# Patient Record
Sex: Female | Born: 1975 | Race: White | Hispanic: No | Marital: Married | Smoking: Never smoker
Health system: Southern US, Community
[De-identification: ages and names within clinical notes are randomized; demographics above are authoritative.]

## PROBLEM LIST (undated history)

## (undated) DIAGNOSIS — R112 Nausea with vomiting, unspecified: Secondary | ICD-10-CM

## (undated) DIAGNOSIS — Z87442 Personal history of urinary calculi: Secondary | ICD-10-CM

## (undated) DIAGNOSIS — Z8489 Family history of other specified conditions: Secondary | ICD-10-CM

## (undated) DIAGNOSIS — C801 Malignant (primary) neoplasm, unspecified: Secondary | ICD-10-CM

## (undated) DIAGNOSIS — Z9889 Other specified postprocedural states: Secondary | ICD-10-CM

## (undated) DIAGNOSIS — T7840XA Allergy, unspecified, initial encounter: Secondary | ICD-10-CM

## (undated) HISTORY — DX: Allergy, unspecified, initial encounter: T78.40XA

## (undated) HISTORY — PX: HAND SURGERY: SHX662

---

## 2004-05-07 ENCOUNTER — Ambulatory Visit: Payer: Self-pay | Admitting: General Surgery

## 2010-09-06 ENCOUNTER — Ambulatory Visit: Payer: Self-pay

## 2010-10-02 ENCOUNTER — Ambulatory Visit: Payer: Self-pay | Admitting: Unknown Physician Specialty

## 2010-10-07 LAB — PATHOLOGY REPORT

## 2013-06-09 HISTORY — PX: MOHS SURGERY: SHX181

## 2014-09-28 DIAGNOSIS — Z85828 Personal history of other malignant neoplasm of skin: Secondary | ICD-10-CM | POA: Insufficient documentation

## 2017-08-24 ENCOUNTER — Encounter
Admission: RE | Disposition: A | Discharge status: Home / Self Care | Payer: Self-pay | Source: Ambulatory Visit | Attending: Obstetrics and Gynecology

## 2017-08-24 ENCOUNTER — Other Ambulatory Visit: Payer: Self-pay

## 2017-08-24 ENCOUNTER — Ambulatory Visit: Payer: BLUE CROSS/BLUE SHIELD | Admitting: Anesthesiology

## 2017-08-24 ENCOUNTER — Ambulatory Visit
Admission: RE | Admit: 2017-08-24 | Discharge: 2017-08-24 | Disposition: A | Discharge status: Home / Self Care | Payer: BLUE CROSS/BLUE SHIELD | Source: Ambulatory Visit | Attending: Obstetrics and Gynecology | Admitting: Obstetrics and Gynecology

## 2017-08-24 DIAGNOSIS — O021 Missed abortion: Secondary | ICD-10-CM | POA: Insufficient documentation

## 2017-08-24 DIAGNOSIS — Z79899 Other long term (current) drug therapy: Secondary | ICD-10-CM | POA: Diagnosis not present

## 2017-08-24 DIAGNOSIS — J302 Other seasonal allergic rhinitis: Secondary | ICD-10-CM | POA: Diagnosis not present

## 2017-08-24 HISTORY — DX: Nausea with vomiting, unspecified: R11.2

## 2017-08-24 HISTORY — DX: Family history of other specified conditions: Z84.89

## 2017-08-24 HISTORY — DX: Malignant (primary) neoplasm, unspecified: C80.1

## 2017-08-24 HISTORY — DX: Other specified postprocedural states: Z98.890

## 2017-08-24 HISTORY — DX: Personal history of urinary calculi: Z87.442

## 2017-08-24 HISTORY — PX: DILATION AND EVACUATION: SHX1459

## 2017-08-24 LAB — CBC
HCT: 39.7 % (ref 35.0–47.0)
HEMOGLOBIN: 13.5 g/dL (ref 12.0–16.0)
MCH: 30.7 pg (ref 26.0–34.0)
MCHC: 33.9 g/dL (ref 32.0–36.0)
MCV: 90.6 fL (ref 80.0–100.0)
Platelets: 311 10*3/uL (ref 150–440)
RBC: 4.38 MIL/uL (ref 3.80–5.20)
RDW: 12.9 % (ref 11.5–14.5)
WBC: 7 10*3/uL (ref 3.6–11.0)

## 2017-08-24 LAB — TYPE AND SCREEN
ABO/RH(D): O POS
Antibody Screen: NEGATIVE

## 2017-08-24 LAB — BASIC METABOLIC PANEL
Anion gap: 8 (ref 5–15)
BUN: 13 mg/dL (ref 6–20)
CHLORIDE: 105 mmol/L (ref 101–111)
CO2: 22 mmol/L (ref 22–32)
Calcium: 8.9 mg/dL (ref 8.9–10.3)
Creatinine, Ser: 0.89 mg/dL (ref 0.44–1.00)
GFR calc Af Amer: >60 mL/min (ref >60)
GFR calc non Af Amer: >60 mL/min (ref >60)
GLUCOSE: 89 mg/dL (ref 65–99)
POTASSIUM: 3.9 mmol/L (ref 3.5–5.1)
Sodium: 135 mmol/L (ref 135–145)

## 2017-08-24 SURGERY — DILATION AND EVACUATION, UTERUS
Anesthesia: General

## 2017-08-24 MED ORDER — PROPOFOL 500 MG/50ML IV EMUL
INTRAVENOUS | Status: DC | PRN
  Administered 2017-08-24: 200 ug/kg/min via INTRAVENOUS

## 2017-08-24 MED ORDER — CEFAZOLIN SODIUM-DEXTROSE 2-4 GM/100ML-% IV SOLN
INTRAVENOUS | Status: AC
  Filled 2017-08-24: qty 100

## 2017-08-24 MED ORDER — PROPOFOL 500 MG/50ML IV EMUL
INTRAVENOUS | Status: AC
  Filled 2017-08-24: qty 50

## 2017-08-24 MED ORDER — DEXAMETHASONE SODIUM PHOSPHATE 10 MG/ML IJ SOLN
INTRAMUSCULAR | Status: AC
  Filled 2017-08-24: qty 1

## 2017-08-24 MED ORDER — HYDROMORPHONE HCL 1 MG/ML IJ SOLN
INTRAMUSCULAR | Status: DC | PRN
  Administered 2017-08-24 (×2): 0.5 mg via INTRAVENOUS

## 2017-08-24 MED ORDER — FAMOTIDINE 20 MG PO TABS
ORAL_TABLET | ORAL | Status: AC
  Administered 2017-08-24: 20 mg via ORAL
  Filled 2017-08-24: qty 1

## 2017-08-24 MED ORDER — ONDANSETRON HCL 4 MG/2ML IJ SOLN
INTRAMUSCULAR | Status: AC
  Filled 2017-08-24: qty 2

## 2017-08-24 MED ORDER — FENTANYL CITRATE (PF) 100 MCG/2ML IJ SOLN
INTRAMUSCULAR | Status: AC
  Administered 2017-08-24: 25 ug via INTRAVENOUS
  Filled 2017-08-24: qty 2

## 2017-08-24 MED ORDER — KETOROLAC TROMETHAMINE 30 MG/ML IJ SOLN
INTRAMUSCULAR | Status: AC
  Filled 2017-08-24: qty 1

## 2017-08-24 MED ORDER — LACTATED RINGERS IV SOLN: INTRAVENOUS | Status: DC

## 2017-08-24 MED ORDER — FAMOTIDINE 20 MG PO TABS
20.0000 mg | ORAL_TABLET | Freq: Once | ORAL | Status: AC
  Administered 2017-08-24: 20 mg via ORAL

## 2017-08-24 MED ORDER — GLYCOPYRROLATE 0.2 MG/ML IJ SOLN
INTRAMUSCULAR | Status: AC
  Filled 2017-08-24: qty 1

## 2017-08-24 MED ORDER — LIDOCAINE HCL (PF) 2 % IJ SOLN
INTRAMUSCULAR | Status: AC
  Filled 2017-08-24: qty 10

## 2017-08-24 MED ORDER — CEFAZOLIN SODIUM-DEXTROSE 2-4 GM/100ML-% IV SOLN
2.0000 g | Freq: Once | INTRAVENOUS | Status: AC
  Administered 2017-08-24: 2 g via INTRAVENOUS

## 2017-08-24 MED ORDER — PROPOFOL 10 MG/ML IV BOLUS
INTRAVENOUS | Status: DC | PRN
  Administered 2017-08-24: 200 mg via INTRAVENOUS

## 2017-08-24 MED ORDER — ONDANSETRON HCL 4 MG/2ML IJ SOLN
INTRAMUSCULAR | Status: DC | PRN
  Administered 2017-08-24: 4 mg via INTRAVENOUS

## 2017-08-24 MED ORDER — SCOPOLAMINE 1 MG/3DAYS TD PT72
1.0000 | MEDICATED_PATCH | TRANSDERMAL | Status: DC
  Administered 2017-08-24: 1.5 mg via TRANSDERMAL

## 2017-08-24 MED ORDER — KETOROLAC TROMETHAMINE 30 MG/ML IJ SOLN
INTRAMUSCULAR | Status: DC | PRN
  Administered 2017-08-24: 30 mg via INTRAVENOUS

## 2017-08-24 MED ORDER — GLYCOPYRROLATE 0.2 MG/ML IJ SOLN
INTRAMUSCULAR | Status: DC | PRN
  Administered 2017-08-24: 0.1 mg via INTRAVENOUS

## 2017-08-24 MED ORDER — PROPOFOL 10 MG/ML IV BOLUS
INTRAVENOUS | Status: AC
  Filled 2017-08-24: qty 20

## 2017-08-24 MED ORDER — LIDOCAINE HCL (CARDIAC) 20 MG/ML IV SOLN
INTRAVENOUS | Status: DC | PRN
  Administered 2017-08-24: 80 mg via INTRAVENOUS

## 2017-08-24 MED ORDER — SCOPOLAMINE 1 MG/3DAYS TD PT72
MEDICATED_PATCH | TRANSDERMAL | Status: AC
  Administered 2017-08-24: 1.5 mg via TRANSDERMAL
  Filled 2017-08-24: qty 1

## 2017-08-24 MED ORDER — LACTATED RINGERS IV SOLN
INTRAVENOUS | Status: DC
  Administered 2017-08-24 (×2): via INTRAVENOUS

## 2017-08-24 MED ORDER — MIDAZOLAM HCL 5 MG/5ML IJ SOLN
INTRAMUSCULAR | Status: AC
  Filled 2017-08-24: qty 5

## 2017-08-24 MED ORDER — PROMETHAZINE HCL 25 MG/ML IJ SOLN: 6.2500 mg | INTRAMUSCULAR | Status: DC | PRN

## 2017-08-24 MED ORDER — DEXAMETHASONE SODIUM PHOSPHATE 10 MG/ML IJ SOLN
INTRAMUSCULAR | Status: DC | PRN
  Administered 2017-08-24: 10 mg via INTRAVENOUS

## 2017-08-24 MED ORDER — MIDAZOLAM HCL 2 MG/2ML IJ SOLN
INTRAMUSCULAR | Status: DC | PRN
  Administered 2017-08-24: 3 mg via INTRAVENOUS
  Administered 2017-08-24: 2 mg via INTRAVENOUS

## 2017-08-24 MED ORDER — ACETAMINOPHEN 10 MG/ML IV SOLN
INTRAVENOUS | Status: AC
  Filled 2017-08-24: qty 100

## 2017-08-24 MED ORDER — FENTANYL CITRATE (PF) 100 MCG/2ML IJ SOLN
25.0000 ug | INTRAMUSCULAR | Status: DC | PRN
  Administered 2017-08-24: 25 ug via INTRAVENOUS

## 2017-08-24 MED ORDER — HYDROMORPHONE HCL 1 MG/ML IJ SOLN
INTRAMUSCULAR | Status: AC
  Filled 2017-08-24: qty 1

## 2017-08-24 MED ORDER — ACETAMINOPHEN 10 MG/ML IV SOLN
INTRAVENOUS | Status: DC | PRN
  Administered 2017-08-24: 1000 mg via INTRAVENOUS

## 2017-08-24 SURGICAL SUPPLY — 20 items
CATH ROBINSON RED A/P 16FR (CATHETERS) ×3 IMPLANT
FILTER UTR ASPR SPEC (MISCELLANEOUS) ×1 IMPLANT
FLTR UTR ASPR SPEC (MISCELLANEOUS) ×3
GLOVE BIO SURGEON STRL SZ8 (GLOVE) ×3 IMPLANT
GOWN STRL REUS W/ TWL LRG LVL3 (GOWN DISPOSABLE) ×1 IMPLANT
GOWN STRL REUS W/ TWL XL LVL3 (GOWN DISPOSABLE) ×1 IMPLANT
GOWN STRL REUS W/TWL LRG LVL3 (GOWN DISPOSABLE) ×2
GOWN STRL REUS W/TWL XL LVL3 (GOWN DISPOSABLE) ×2
KIT TURNOVER CYSTO (KITS) ×3 IMPLANT
PACK DNC HYST (MISCELLANEOUS) ×3 IMPLANT
PAD OB MATERNITY 4.3X12.25 (PERSONAL CARE ITEMS) ×3 IMPLANT
PAD PREP 24X41 OB/GYN DISP (PERSONAL CARE ITEMS) ×3 IMPLANT
SET BERKELEY SUCTION TUBING (SUCTIONS) ×3 IMPLANT
TOWEL OR 17X26 4PK STRL BLUE (TOWEL DISPOSABLE) ×3 IMPLANT
VACURETTE 10 RIGID CVD (CANNULA) IMPLANT
VACURETTE 6 ASPIR F TIP BERK (CANNULA) IMPLANT
VACURETTE 7MM F TIP (CANNULA)
VACURETTE 7MM F TIP STRL (CANNULA) IMPLANT
VACURETTE 8 RIGID CVD (CANNULA) IMPLANT
VACURETTE 8MM F TIP (MISCELLANEOUS) ×3 IMPLANT

## 2017-08-24 NOTE — Anesthesia Post-op Follow-up Note (Signed)
Anesthesia QCDR form completed.        

## 2017-08-24 NOTE — OR Nursing (Signed)
Discharge instructions discussed with pt and husband. Both voice understanding. 

## 2017-08-24 NOTE — H&P (Signed)
Chief Complaint:    Diane Love is a 42 y.o. female here for Referred by Lurena Joiner McVey-missed ab .pt with LMP 06/24/17 = 8+2 weeks   u/s from 08/17/17 shows irregular thickening with projections into endometrial cavity . Quant 56,000 and now 50,000. No specific pain     Past Medical History:  has a past medical history of Seasonal allergies.  Past Surgical History:  has no past surgical history on file. Family History: family history is not on file. Social History:  reports that she has never smoked. She has never used smokeless tobacco. She reports that she drinks alcohol. She reports that she does not use drugs. OB/GYN History:          OB History    Gravida  3   Para  1   Term  1   Preterm      AB  1   Living  1     SAB      TAB  1   Ectopic      Molar      Multiple      Live Births  1          Allergies: has No Known Allergies. Medications:  Current Outpatient Medications:  .  CETIRIZINE HCL (ZYRTEC ORAL), Take by mouth., Disp: , Rfl:  .  fexofenadine (ALLEGRA) 180 MG tablet, Take 180 mg by mouth once daily., Disp: , Rfl:  .  fluticasone (FLONASE) 50 mcg/actuation nasal spray, Place 2 sprays into both nostrils once daily., Disp: , Rfl:  .  multivitamin tablet, Take 1 tablet by mouth once daily., Disp: , Rfl:  .  UNABLE TO FIND, Sublingual Allergy drops, Disp: , Rfl:   Review of Systems: General:                      No fatigue or weight loss Eyes:                           No vision changes Ears:                            No hearing difficulty Respiratory:                No cough or shortness of breath Pulmonary:                  No asthma or shortness of breath Cardiovascular:           No chest pain, palpitations, dyspnea on exertion Gastrointestinal:          No abdominal bloating, chronic diarrhea, constipations, masses, pain or hematochezia Genitourinary:             No hematuria, dysuria, abnormal vaginal discharge, pelvic pain,  Menometrorrhagia Lymphatic:                   No swollen lymph nodes Musculoskeletal:         No muscle weakness Neurologic:                  No extremity weakness, syncope, seizure disorder Psychiatric:                  No history of depression, delusions or suicidal/homicidal ideation    Exam:   Vitals:   08/21/17 0920  BP: 114/73  Pulse: 97    Body mass  index is 26.46 kg/m.  WDWN white/ female in NAD   Lungs: CTA  CV : RRR without murmur   Breast: exam done in sitting and lying position : No dimpling or retraction, no dominant mass, no spontaneous discharge, no axillary adenopathy Neck:  no thyromegaly Abdomen: soft , no mass, normal active bowel sounds,  non-tender, no rebound tenderness Pelvic: tanner stage 5 ,  External genitalia: vulva /labia no lesions Urethra: no prolapse Vagina: normal physiologic d/c Cervix: no lesions, no cervical motion tenderness   Uterus: 8 weeks , non-tender Adnexa: no mass,  non-tender   Rectovaginal: no mass heme   Impression:   The encounter diagnosis was Missed abortion. r/o molar pregnancy     Plan:   Suction Dilation and curettage   Benefits and risks to surgery: The proposed benefit of the surgery has been discussed with the patient. The possible risks include, but are not limited to: organ injury to the bowel , bladder, ureters, and major blood vessels and nerves. There is a possibility of additional surgeries resulting from these injuries. There is also the risk of blood transfusion and the need to receive blood products during or after the procedure which may rarely lead to HIV or Hepatitis C infection. There is a risk of developing a deep venous thrombosis or a pulmonary embolism . There is the possibility of wound infection and also anesthetic complications, even the rare possibility of death. The patient understands these risks and wishes to proceed. All questions have been answered and the consent has been  signed

## 2017-08-24 NOTE — Progress Notes (Signed)
Pt ready for suction D+C . NPO. All questions answered . Proceed

## 2017-08-24 NOTE — Anesthesia Procedure Notes (Signed)
Procedure Name: LMA Insertion Date/Time: 08/24/2017 12:56 PM Performed by: Sherol DadeMacMang, Kalijah Zeiss H, CRNA Pre-anesthesia Checklist: Patient identified, Emergency Drugs available, Suction available, Patient being monitored and Timeout performed Patient Re-evaluated:Patient Re-evaluated prior to induction Oxygen Delivery Method: Circle system utilized Preoxygenation: Pre-oxygenation with 100% oxygen Induction Type: IV induction Ventilation: Mask ventilation without difficulty LMA: LMA inserted LMA Size: 3.5 Number of attempts: 1 Placement Confirmation: positive ETCO2,  CO2 detector and breath sounds checked- equal and bilateral Tube secured with: Tape

## 2017-08-24 NOTE — Discharge Instructions (Signed)

## 2017-08-24 NOTE — Op Note (Signed)
[name], [name]                ACCOUNT NO.:  [id number]  MEDICAL RECORD NO.:  [id number]  LOCATION:                                 FACILITY:  PHYSICIAN:  Jennell Cornerhomas Schermerhorn, MDDATE OF BIRTH:  [birth year]  DATE OF PROCEDURE:  08/24/2017 DATE OF DISCHARGE:                              OPERATIVE REPORT   PREOPERATIVE DIAGNOSIS:  Missed abortion.  POSTOPERATIVE DIAGNOSIS:  Missed abortion.  PROCEDURE PERFORMED:  Suction dilation curettage.  SURGEON:  Jennell Cornerhomas Schermerhorn, MD  ANESTHESIA:  General endotracheal anesthesia.  INDICATION:  This is a 42 year old, gravida 3, para 1 patient with abnormal rising quantitative hCGs with plateauing.  Last quantitative hCG was 50,000 and clinic ultrasound failed to show intrauterine fetal pole.  There was evidence of just an irregular-shaped gestational sac.  DESCRIPTION OF PROCEDURE:  After adequate general endotracheal anesthesia, the patient was placed in dorsal supine position with the legs in the candy-cane stirrups.  Lower abdomen, perineum, and vagina were prepped and draped in normal sterile fashion.  The patient did receive 2 g IV Ancef for surgical prophylaxis prior to commencement of the case.  Time-out was performed.  Straight catheterization of the bladder yielded 100 mL clear urine.  A weighted speculum was placed in the posterior vaginal vault and the anterior cervix was grasped with single-tooth tenaculum.  The uterus was mid plane positioned based on examination.  The cervix was then dilated to #20 Hanks dilator without difficulty.  A #8 flexible suction curette was placed into the endometrial cavity and tissue was removed consistent with products of conception.  A sharp curettage was performed with good uterine cry throughout and repeat suction curetting was performed with no additional tissue.  Good hemostasis was noted.  There were no complications.  The patient tolerated the procedure well.  ESTIMATED BLOOD LOSS:   25 mL.  URINE OUTPUT:  100 mL.  INTRAOPERATIVE FLUIDS:  1 L.  The patient was taken to recovery room in good condition.          ______________________________ Jennell Cornerhomas Schermerhorn, MD     TS/MEDQ  D:  08/24/2017  T:  08/24/2017  Job:  [id number]

## 2017-08-24 NOTE — Transfer of Care (Signed)
Immediate Anesthesia Transfer of Care Note  Patient: Diane Love  Procedure(s) Performed: DILATATION AND EVACUATION (N/A )  Patient Location: PACU  Anesthesia Type:General  Level of Consciousness: drowsy and patient cooperative  Airway & Oxygen Therapy: Patient Spontanous Breathing and Patient connected to face mask oxygen  Post-op Assessment: Report given to RN, Post -op Vital signs reviewed and stable and Patient moving all extremities X 4  Post vital signs: Reviewed and stable  Last Vitals:  Vitals:   08/24/17 1051 08/24/17 1334  BP: 116/63 (!) 97/59  Pulse: 90 89  Resp: 20 13  Temp: 36.6 C 36.7 C  SpO2: 100% 100%    Last Pain:  Vitals:   08/24/17 1051  TempSrc: Oral         Complications: No apparent anesthesia complications

## 2017-08-24 NOTE — Brief Op Note (Signed)
08/24/2017  1:17 PM  PATIENT:  Jon BillingsAndrea L Anzaldo  42 y.o. female  PRE-OPERATIVE DIAGNOSIS:  Missed Abortion  POST-OPERATIVE DIAGNOSIS: same PROCEDURE:  Procedure(s): DILATATION AND EVACUATION (N/A) Suction  SURGEON:  Surgeon(s) and Role:    * Schermerhorn, Ihor Austinhomas J, MD - Primary  PHYSICIAN ASSISTANT:   ASSISTANTS: none   ANESTHESIA:   general  EBL:  25 mL   BLOOD ADMINISTERED:none  DRAINS: none   LOCAL MEDICATIONS USED:  NONE  SPECIMEN:  Source of Specimen:  products of conception   DISPOSITION OF SPECIMEN:  PATHOLOGY  COUNTS:  YES  TOURNIQUET:  * No tourniquets in log *  DICTATION: .Other Dictation: Dictation Number verbal  PLAN OF CARE: Discharge to home after PACU  PATIENT DISPOSITION:  PACU - hemodynamically stable.   Delay start of Pharmacological VTE agent (>24hrs) due to surgical blood loss or risk of bleeding: not applicable

## 2017-08-24 NOTE — Anesthesia Preprocedure Evaluation (Signed)
Anesthesia Evaluation  Patient identified by MRN, date of birth, ID band Patient awake    Reviewed: Allergy & Precautions, H&P , NPO status , Patient's Chart, lab work & pertinent test results  History of Anesthesia Complications (+) PONV, Family history of anesthesia reaction and history of anesthetic complications  Airway Mallampati: III  TM Distance: <3 FB Neck ROM: full    Dental  (+) Chipped, Poor Dentition   Pulmonary neg pulmonary ROS, neg shortness of breath,           Cardiovascular Exercise Tolerance: Good (-) angina(-) Past MI and (-) DOE negative cardio ROS       Neuro/Psych negative neurological ROS  negative psych ROS   GI/Hepatic negative GI ROS, Neg liver ROS, neg GERD  ,  Endo/Other  negative endocrine ROS  Renal/GU      Musculoskeletal   Abdominal   Peds  Hematology negative hematology ROS (+)   Anesthesia Other Findings Past Medical History: No date: Cancer (HCC) No date: Family history of adverse reaction to anesthesia No date: History of kidney stones No date: PONV (postoperative nausea and vomiting)  Past Surgical History: No date: HAND SURGERY; Right     Comment:  Thumb tumor excision 2015: MOHS SURGERY     Comment:  BCC excision nose  BMI    Body Mass Index:  26.46 kg/m      Reproductive/Obstetrics negative OB ROS                             Anesthesia Physical Anesthesia Plan  ASA: II  Anesthesia Plan: General LMA   Post-op Pain Management:    Induction: Intravenous  PONV Risk Score and Plan: Ondansetron, Dexamethasone, Propofol infusion, TIVA, Midazolam and Scopolamine patch - Pre-op  Airway Management Planned: LMA  Additional Equipment:   Intra-op Plan:   Post-operative Plan: Extubation in OR  Informed Consent: I have reviewed the patients History and Physical, chart, labs and discussed the procedure including the risks, benefits and  alternatives for the proposed anesthesia with the patient or authorized representative who has indicated his/her understanding and acceptance.   Dental Advisory Given  Plan Discussed with: Anesthesiologist, CRNA and Surgeon  Anesthesia Plan Comments: (Patient consented for risks of anesthesia including but not limited to:  - adverse reactions to medications - damage to teeth, lips or other oral mucosa - sore throat or hoarseness - Damage to heart, brain, lungs or loss of life  Patient voiced understanding.)        Anesthesia Quick Evaluation

## 2017-08-25 ENCOUNTER — Encounter: Payer: Self-pay | Admitting: Obstetrics and Gynecology

## 2017-08-25 LAB — SURGICAL PATHOLOGY

## 2017-08-25 NOTE — Anesthesia Postprocedure Evaluation (Signed)
Anesthesia Post Note  Patient: Diane Love  Procedure(s) Performed: DILATATION AND EVACUATION (N/A )  Patient location during evaluation: PACU Anesthesia Type: General Level of consciousness: awake and alert Pain management: pain level controlled Vital Signs Assessment: post-procedure vital signs reviewed and stable Respiratory status: spontaneous breathing, nonlabored ventilation, respiratory function stable and patient connected to nasal cannula oxygen Cardiovascular status: blood pressure returned to baseline and stable Postop Assessment: no apparent nausea or vomiting Anesthetic complications: no     Last Vitals:  Vitals:   08/24/17 1500 08/24/17 1505  BP: (!) 94/59 98/62  Pulse: 62 (!) 56  Resp: 16 16  Temp: 37.3 C   SpO2: 100% 100%    Last Pain:  Vitals:   08/24/17 1427  TempSrc:   PainSc: 4                  Cleda MccreedyJoseph K Piscitello

## 2017-11-23 ENCOUNTER — Other Ambulatory Visit: Payer: Self-pay | Admitting: Obstetrics and Gynecology

## 2017-11-23 DIAGNOSIS — Z1231 Encounter for screening mammogram for malignant neoplasm of breast: Secondary | ICD-10-CM

## 2017-12-14 ENCOUNTER — Encounter (HOSPITAL_COMMUNITY): Payer: Self-pay

## 2017-12-14 ENCOUNTER — Ambulatory Visit
Admission: RE | Admit: 2017-12-14 | Discharge: 2017-12-14 | Disposition: A | Discharge status: Home / Self Care | Payer: BLUE CROSS/BLUE SHIELD | Source: Ambulatory Visit | Attending: Obstetrics and Gynecology | Admitting: Obstetrics and Gynecology

## 2017-12-14 DIAGNOSIS — Z1231 Encounter for screening mammogram for malignant neoplasm of breast: Secondary | ICD-10-CM

## 2018-02-24 ENCOUNTER — Other Ambulatory Visit: Payer: Self-pay | Admitting: Unknown Physician Specialty

## 2018-02-24 DIAGNOSIS — M25462 Effusion, left knee: Secondary | ICD-10-CM

## 2018-02-24 DIAGNOSIS — M25562 Pain in left knee: Principal | ICD-10-CM

## 2018-02-24 DIAGNOSIS — G8929 Other chronic pain: Secondary | ICD-10-CM

## 2018-02-27 ENCOUNTER — Ambulatory Visit
Admission: RE | Admit: 2018-02-27 | Discharge: 2018-02-27 | Disposition: A | Discharge status: Home / Self Care | Payer: BLUE CROSS/BLUE SHIELD | Source: Ambulatory Visit | Attending: Unknown Physician Specialty | Admitting: Unknown Physician Specialty

## 2018-02-27 DIAGNOSIS — M7122 Synovial cyst of popliteal space [Baker], left knee: Secondary | ICD-10-CM | POA: Diagnosis not present

## 2018-02-27 DIAGNOSIS — G8929 Other chronic pain: Secondary | ICD-10-CM | POA: Diagnosis present

## 2018-02-27 DIAGNOSIS — M25462 Effusion, left knee: Secondary | ICD-10-CM | POA: Insufficient documentation

## 2018-02-27 DIAGNOSIS — M25562 Pain in left knee: Secondary | ICD-10-CM | POA: Insufficient documentation

## 2019-04-21 ENCOUNTER — Other Ambulatory Visit: Payer: Self-pay

## 2019-04-21 DIAGNOSIS — Z20822 Contact with and (suspected) exposure to covid-19: Secondary | ICD-10-CM

## 2019-04-23 LAB — NOVEL CORONAVIRUS, NAA: SARS-CoV-2, NAA: NOT DETECTED

## 2019-06-23 IMAGING — MR MR KNEE*L* W/O CM
6 series · 40 of 40 positions shown · non-contrast
Comparison: None.

CLINICAL DATA: Chronic knee pain and swelling. Increased swelling
with exercise. Symptoms for 6 months. No acute injury or prior
relevant surgery.

EXAM:
MRI OF THE LEFT KNEE WITHOUT CONTRAST
TECHNIQUE: Multiplanar, multisequence MR imaging of the knee was performed. No
intravenous contrast was administered.

[Series 12: T2 fat-sat · axial · left · 4.0mm · 0.50mm/px · z∈[-63,+61]mm · 7 of 26 slices shown (1 of 3)]
[im 1/26]
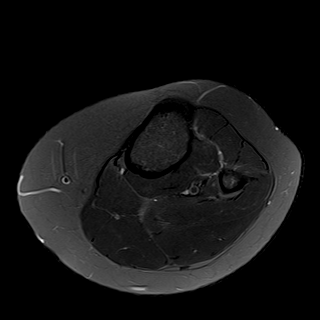
[im 5/26]
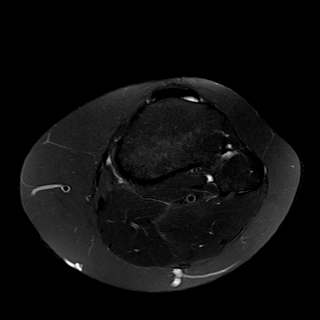
[im 9/26]
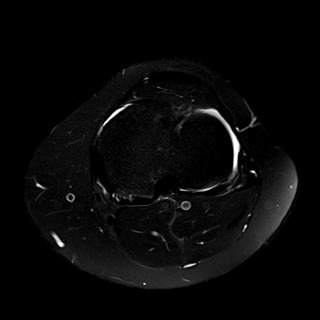
[im 13/26]
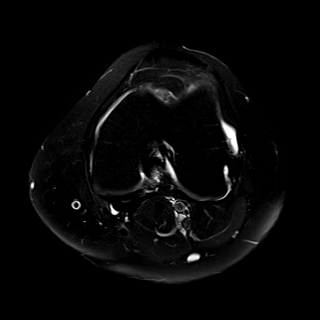
[im 17/26]
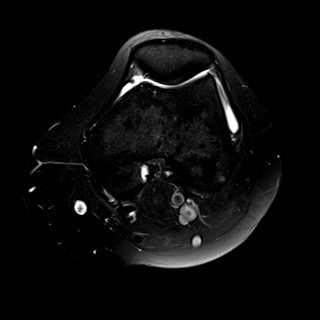
[im 21/26]
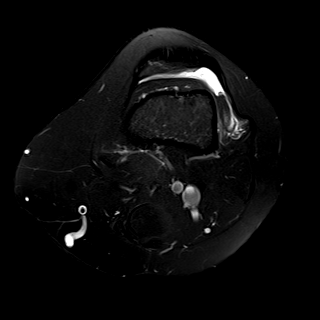
[im 26/26]
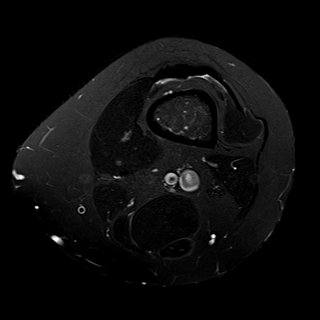

[Series 13: T1 · coronal · left · 4.0mm · 0.59mm/px · 6 of 26 slices shown]
[im 1/26]
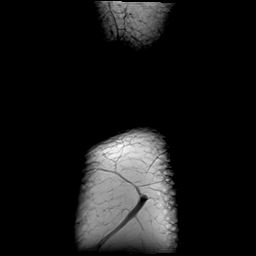
[im 6/26]
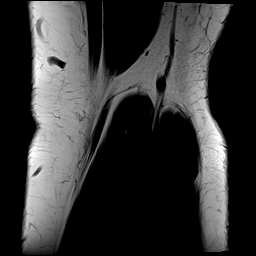
[im 11/26]
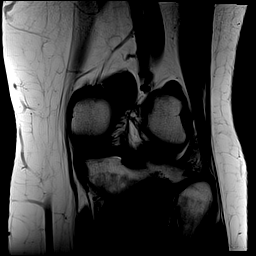
[im 16/26]
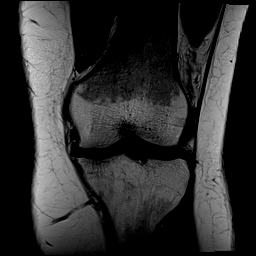
[im 21/26]
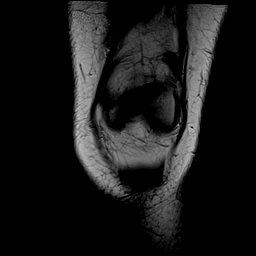
[im 26/26]
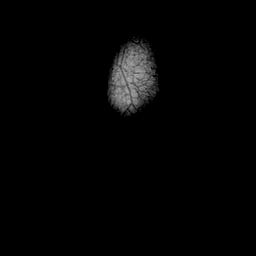

[Series 15: PD fat-sat · coronal · left · 4.0mm · 0.59mm/px · 6 of 25 slices shown (1 of 2)]
[im 1/25]
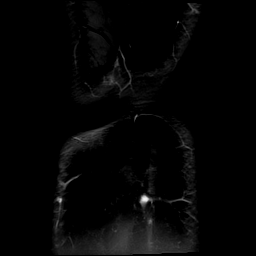
[im 5/25]
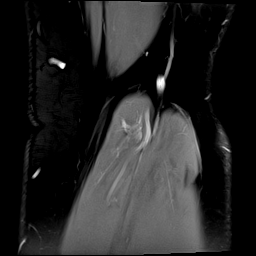
[im 10/25]
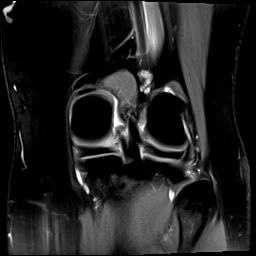
[im 15/25]
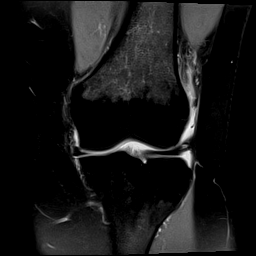
[im 20/25]
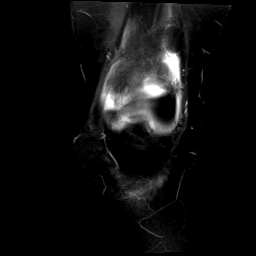
[im 25/25]
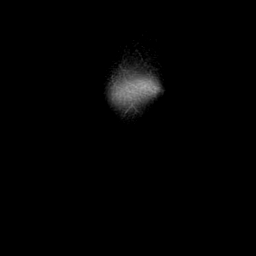

[Series 16: T2 fat-sat · coronal · left · 4.0mm · 0.59mm/px · 7 of 28 slices shown (2 of 3)]
[im 1/28]
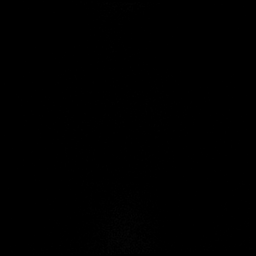
[im 5/28]
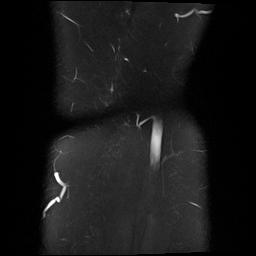
[im 10/28]
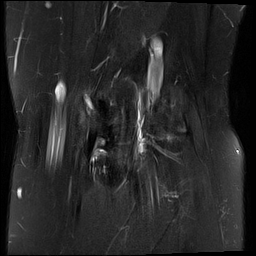
[im 14/28]
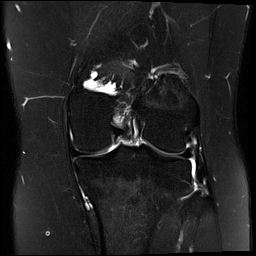
[im 19/28]
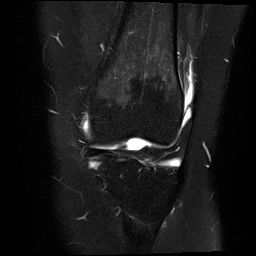
[im 23/28]
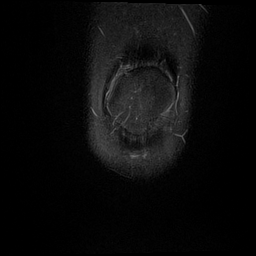
[im 28/28]
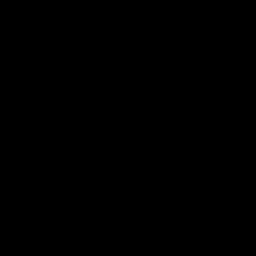

[Series 17: PD fat-sat · sagittal · left · 3.0mm · 0.59mm/px · 7 of 28 slices shown (2 of 2)]
[im 1/28]
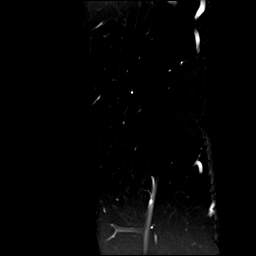
[im 5/28]
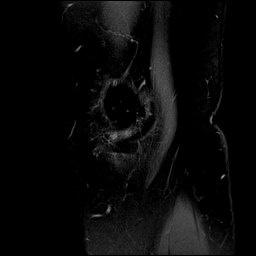
[im 10/28]
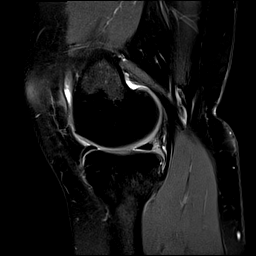
[im 14/28]
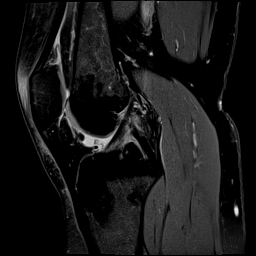
[im 19/28]
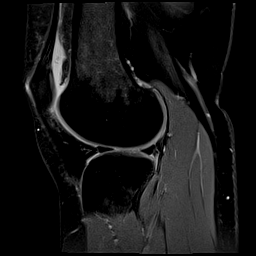
[im 23/28]
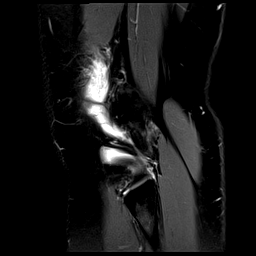
[im 28/28]
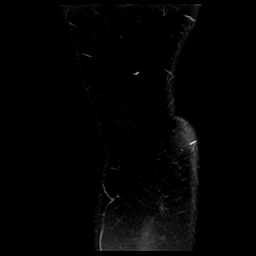

[Series 18: T2 fat-sat · sagittal · left · 3.0mm · 0.59mm/px · 7 of 28 slices shown (3 of 3)]
[im 1/28]
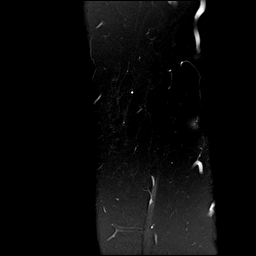
[im 5/28]
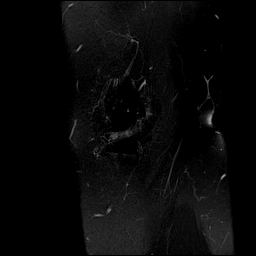
[im 10/28]
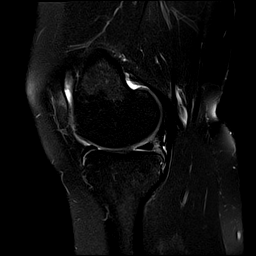
[im 14/28]
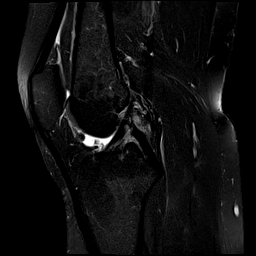
[im 19/28]
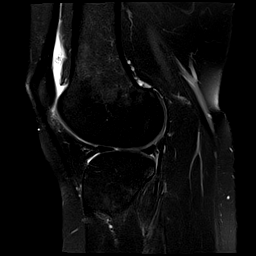
[im 23/28]
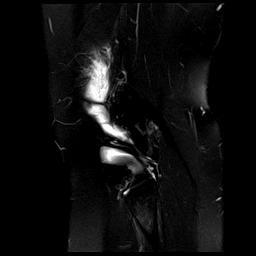
[im 28/28]
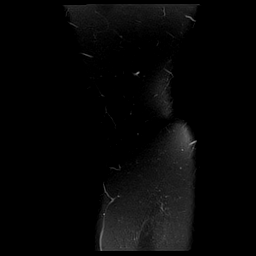

[40 of 40 positions shown; findings below may reference images not displayed]

FINDINGS: MENISCI

Medial meniscus:  Intact with normal morphology.

Lateral meniscus:  Intact with normal morphology.

LIGAMENTS

Cruciates:  Intact.

Collaterals:  Intact.

CARTILAGE

Patellofemoral:  Preserved.

Medial:  Preserved.

Lateral:  Preserved.

MISCELLANEOUS

Joint:  Small joint effusion.

Popliteal Fossa:  Tiny Baker's cyst.

Extensor Mechanism:  Intact.

Bones:  No acute or significant extra-articular osseous findings.

Other: No other significant periarticular soft tissue findings.
IMPRESSION: 1. Small joint effusion and tiny Baker's cyst.
2. No evidence of internal derangement. The menisci, cruciate and
collateral ligaments appear normal.
3. No acute osseous findings or significant arthropathic changes.

## 2022-06-03 ENCOUNTER — Ambulatory Visit: Payer: 59 | Admitting: Family Medicine

## 2022-07-25 ENCOUNTER — Telehealth: Payer: Self-pay | Admitting: Family Medicine

## 2022-08-17 NOTE — Progress Notes (Unsigned)
I,Sha'taria Tyson,acting as a Education administrator for Gwyneth Sprout, FNP.,have documented all relevant documentation on the behalf of Gwyneth Sprout, FNP,as directed by  Gwyneth Sprout, FNP while in the presence of Gwyneth Sprout, FNP.   New patient visit  Patient: Diane Love   DOB: March 03, 1976   47 y.o. Female  MRN: [id number] Visit Date: 08/18/2022  Today's healthcare provider: Gwyneth Sprout, FNP  Patient presents for new patient visit to establish care.  Introduced to Designer, jewellery role and practice setting.  All questions answered.  Discussed provider/patient relationship and expectations.  Subjective    Diane Love is a 47 y.o. female who presents today as a new patient to establish care.   HPI  -Pap Smear: [place] [id number] 09/2016; due at this time; pt wishes to defer. -Colonoscopy: Speak with insurance to make sure it is covered; OK to defer at this time -Vaccines: Patient has record at home mainly gets through work or pharmacy; encouraged to upload thru mychart as a message attachment  Past Medical History:  Diagnosis Date   Allergy    Seasonal since moving to  in [place] ([place])    Skin, basil cell & pre carcinoma   Family history of adverse reaction to anesthesia    History of kidney stones    PONV (postoperative nausea and vomiting)    Past Surgical History:  Procedure Laterality Date   DILATION AND EVACUATION N/A 08/24/2017   Procedure: DILATATION AND EVACUATION;  Surgeon: Schermerhorn, Gwen Her, MD;  Location: [facility] ORS;  Service: Gynecology;  Laterality: N/A;   HAND SURGERY Right    Thumb tumor excision   MOHS SURGERY  2015   BCC excision nose   Family Status  Relation Name Status   Mother Theophilus Kinds (Not Specified)   MGM Gay Filler (Not Specified)   PGM Curt Bears (Not Specified)   Family History  Problem Relation Age of Onset   Breast cancer Mother 65   Cancer Mother    Varicose Veins Mother    Diabetes Maternal Grandmother    Diabetes  Paternal Grandmother    Social History   Socioeconomic History   Marital status: Married    Spouse name: Not on file   Number of children: Not on file   Years of education: Not on file   Highest education level: Not on file  Occupational History   Not on file  Tobacco Use   Smoking status: Never   Smokeless tobacco: Never  Vaping Use   Vaping Use: Never used  Substance and Sexual Activity   Alcohol use: Yes    Alcohol/week: 3.0 standard drinks of alcohol    Types: 3 Glasses of wine per week    Comment: rarely   Drug use: No   Sexual activity: Yes    Birth control/protection: None  Other Topics Concern   Not on file  Social History Narrative   Not on file   Social Determinants of Health   Financial Resource Strain: Not on file  Food Insecurity: Not on file  Transportation Needs: Not on file  Physical Activity: Not on file  Stress: Not on file  Social Connections: Not on file   Outpatient Medications Prior to Visit  Medication Sig   Cetirizine HCl 10 MG CAPS Take 10 mg by mouth daily.   Fexofenadine HCl (ALLEGRA PO) Take by mouth daily.   fluticasone (FLONASE) 50 MCG/ACT nasal spray Place 1 spray into both nostrils daily as  needed for allergies or rhinitis.   loratadine (CLARITIN) 10 MG tablet Take 10 mg by mouth daily. (Patient not taking: Reported on 08/18/2022)   No facility-administered medications prior to visit.   No Known Allergies  Immunization History  Administered Date(s) Administered   Influenza,inj,Quad PF,6+ Mos 02/25/2019   Influenza-Unspecified 02/11/2022    Health Maintenance  Topic Date Due   COVID-19 Vaccine (1) Never done   Hepatitis C Screening  Never done   DTaP/Tdap/Td (1 - Tdap) Never done   PAP SMEAR-Modifier  Never done   COLONOSCOPY (Pts 45-27yr Insurance coverage will need to be confirmed)  Never done   INFLUENZA VACCINE  Completed   HIV Screening  Completed   HPV VACCINES  Aged Out    Patient Care Team: PGwyneth Sprout FNP  as PCP - General (Family Medicine)  Review of Systems  HENT:  Positive for congestion, postnasal drip and sinus pressure.   Eyes:  Positive for itching.  Allergic/Immunologic: Positive for environmental allergies.      Objective    BP 115/85 (BP Location: Right Arm, Patient Position: Sitting, Cuff Size: Normal)   Pulse 71   Ht '5\' 5"'$  (1.651 m)   Wt 165 lb (74.8 kg)   SpO2 100%   BMI 27.46 kg/m    Physical Exam Vitals and nursing note reviewed.  Constitutional:      General: She is not in acute distress.    Appearance: Normal appearance. She is overweight. She is not ill-appearing, toxic-appearing or diaphoretic.  HENT:     Head: Normocephalic and atraumatic.  Cardiovascular:     Rate and Rhythm: Normal rate and regular rhythm.     Pulses: Normal pulses.     Heart sounds: Normal heart sounds. No murmur heard.    No friction rub. No gallop.  Pulmonary:     Effort: Pulmonary effort is normal. No respiratory distress.     Breath sounds: Normal breath sounds. No stridor. No wheezing, rhonchi or rales.  Chest:     Chest wall: No tenderness.  Musculoskeletal:        General: No swelling, tenderness, deformity or signs of injury. Normal range of motion.     Right lower leg: No edema.     Left lower leg: No edema.  Skin:    General: Skin is warm and dry.     Capillary Refill: Capillary refill takes less than 2 seconds.     Coloration: Skin is not jaundiced or pale.     Findings: No bruising, erythema, lesion or rash.          Comments: Arrow indicates site of stinging pain   Neurological:     General: No focal deficit present.     Mental Status: She is alert and oriented to person, place, and time. Mental status is at baseline.     Cranial Nerves: No cranial nerve deficit.     Sensory: No sensory deficit.     Motor: No weakness.     Coordination: Coordination normal.  Psychiatric:        Mood and Affect: Mood normal.        Behavior: Behavior normal.        Thought  Content: Thought content normal.        Judgment: Judgment normal.    Depression Screen    08/18/2022    1:50 PM  PHQ 2/9 Scores  PHQ - 2 Score 0  PHQ- 9 Score 0   No results found for  any visits on 08/18/22.  Assessment & Plan      Problem List Items Addressed This Visit       Cardiovascular and Mediastinum   Varicose veins of left lower extremity with pain    Chronic, variable Elevated vein with proximal stinging sensation Wears compression hose to assist; however, does not assist with pain recovery        Other   Encounter for hepatitis C screening test for low risk patient    Low risk screen Treatable, and curable. If left untreated Hep C can lead to cirrhosis and liver failure. Encourage routine testing; recommend repeat testing if risk factors change.       Relevant Orders   Hepatitis C Antibody   Encounter for lipid screening for cardiovascular disease    Recommend LP for baseline recommend diet low in saturated fat and regular exercise - 30 min at least 5 times per week       Relevant Orders   Lipid panel   Encounter for screening for hematologic disorder    Recommend CBC for anemia/infection screening Notes varicose vein; however, no ecchymosis       Relevant Orders   CBC with Differential/Platelet   Encounter for screening for HIV    Low risk screen Consented; encouraged to "know your status" Recommend repeat screen if risk factors change       Relevant Orders   HIV antibody (with reflex)   Establishing care with new doctor, encounter for - Primary    Concerns over LLE varicose vein with pain       History of skin cancer    Routine visits with derm; wears sunscreen and long layers  No current concerns       Screening for diabetes mellitus    Recommend A1c as baseline as well as family hx of DM Continue to recommend balanced, lower carb meals. Smaller meal size, adding snacks. Choosing water as drink of choice and increasing purposeful  exercise.       Relevant Orders   Hemoglobin A1c   Screening for metabolic disorder    Recommend CMP given age and mult years without PC No current complaints outside of allergies and varicose vein       Relevant Orders   Comprehensive Metabolic Panel (CMET)   Screening for thyroid disorder    Recommend screening for thyroid for initial care; denies concerns at this time      Relevant Orders   TSH + free T4   Screening mammogram for breast cancer    Due for screening for mammogram, denies breast concerns, provided with phone number to call and schedule appointment for mammogram. Encouraged to repeat breast cancer screening every 1-2 years. Family hx of breast cancer.      Relevant Orders   MM 3D SCREENING MAMMOGRAM BILATERAL BREAST   Return in about 6 months (around 02/18/2023) for annual examination w/PAP.    Vonna Kotyk, FNP, have reviewed all documentation for this visit. The documentation on 08/18/22 for the exam, diagnosis, procedures, and orders are all accurate and complete.  Gwyneth Sprout, [place] [phone] (phone) [phone] (fax)  [place]

## 2022-08-18 ENCOUNTER — Encounter: Payer: Self-pay | Admitting: Family Medicine

## 2022-08-18 ENCOUNTER — Ambulatory Visit (INDEPENDENT_AMBULATORY_CARE_PROVIDER_SITE_OTHER): Payer: 59 | Admitting: Family Medicine

## 2022-08-18 VITALS — BP 115/85 | HR 71 | Ht 65.0 in | Wt 165.0 lb

## 2022-08-18 DIAGNOSIS — Z130 Encounter for screening for diseases of the blood and blood-forming organs and certain disorders involving the immune mechanism: Secondary | ICD-10-CM | POA: Insufficient documentation

## 2022-08-18 DIAGNOSIS — Z114 Encounter for screening for human immunodeficiency virus [HIV]: Secondary | ICD-10-CM | POA: Insufficient documentation

## 2022-08-18 DIAGNOSIS — Z13228 Encounter for screening for other metabolic disorders: Secondary | ICD-10-CM | POA: Insufficient documentation

## 2022-08-18 DIAGNOSIS — Z85828 Personal history of other malignant neoplasm of skin: Secondary | ICD-10-CM | POA: Insufficient documentation

## 2022-08-18 DIAGNOSIS — I83812 Varicose veins of left lower extremities with pain: Secondary | ICD-10-CM

## 2022-08-18 DIAGNOSIS — Z7689 Persons encountering health services in other specified circumstances: Secondary | ICD-10-CM | POA: Diagnosis not present

## 2022-08-18 DIAGNOSIS — Z1159 Encounter for screening for other viral diseases: Secondary | ICD-10-CM | POA: Insufficient documentation

## 2022-08-18 DIAGNOSIS — Z1329 Encounter for screening for other suspected endocrine disorder: Secondary | ICD-10-CM | POA: Insufficient documentation

## 2022-08-18 DIAGNOSIS — Z1211 Encounter for screening for malignant neoplasm of colon: Secondary | ICD-10-CM

## 2022-08-18 DIAGNOSIS — Z136 Encounter for screening for cardiovascular disorders: Secondary | ICD-10-CM

## 2022-08-18 DIAGNOSIS — G8929 Other chronic pain: Secondary | ICD-10-CM

## 2022-08-18 DIAGNOSIS — Z1231 Encounter for screening mammogram for malignant neoplasm of breast: Secondary | ICD-10-CM | POA: Insufficient documentation

## 2022-08-18 DIAGNOSIS — Z131 Encounter for screening for diabetes mellitus: Secondary | ICD-10-CM | POA: Insufficient documentation

## 2022-08-18 DIAGNOSIS — Z13220 Encounter for screening for lipoid disorders: Secondary | ICD-10-CM | POA: Insufficient documentation

## 2022-08-18 HISTORY — DX: Varicose veins of left lower extremity with pain: I83.812

## 2022-08-18 NOTE — Assessment & Plan Note (Signed)
Routine visits with derm; wears sunscreen and long layers  No current concerns

## 2022-08-18 NOTE — Assessment & Plan Note (Signed)
Due for screening for mammogram, denies breast concerns, provided with phone number to call and schedule appointment for mammogram. Encouraged to repeat breast cancer screening every 1-2 years. Family hx of breast cancer.

## 2022-08-18 NOTE — Assessment & Plan Note (Signed)
Low risk screen ?Consented; encouraged to "know your status" ?Recommend repeat screen if risk factors change ? ?

## 2022-08-18 NOTE — Assessment & Plan Note (Signed)
Low risk screen Treatable, and curable. If left untreated Hep C can lead to cirrhosis and liver failure. Encourage routine testing; recommend repeat testing if risk factors change.  

## 2022-08-18 NOTE — Assessment & Plan Note (Signed)
Chronic, variable Elevated vein with proximal stinging sensation Wears compression hose to assist; however, does not assist with pain recovery

## 2022-08-18 NOTE — Assessment & Plan Note (Signed)
Recommend CBC for anemia/infection screening Notes varicose vein; however, no ecchymosis

## 2022-08-18 NOTE — Assessment & Plan Note (Signed)
Recommend CMP given age and mult years without PC No current complaints outside of allergies and varicose vein

## 2022-08-18 NOTE — Assessment & Plan Note (Signed)
Concerns over LLE varicose vein with pain

## 2022-08-18 NOTE — Patient Instructions (Signed)
Please call and schedule your mammogram:  [facility]  [address], [address] [facility] [place],  [place]  [address] Get Driving Directions Main: [phone]  Sunday:Closed Monday:7:20 AM - 5:00 PM Tuesday:7:20 AM - 5:00 PM Wednesday:7:20 AM - 5:00 PM Thursday:7:20 AM - 5:00 PM Friday:7:20 AM - 4:30 PM Saturday:Closed  

## 2022-08-18 NOTE — Assessment & Plan Note (Signed)
Recommend LP for baseline recommend diet low in saturated fat and regular exercise - 30 min at least 5 times per week

## 2022-08-18 NOTE — Assessment & Plan Note (Signed)
Recommend screening for thyroid for initial care; denies concerns at this time

## 2022-08-18 NOTE — Assessment & Plan Note (Signed)
Recommend A1c as baseline as well as family hx of DM Continue to recommend balanced, lower carb meals. Smaller meal size, adding snacks. Choosing water as drink of choice and increasing purposeful exercise.

## 2022-08-19 LAB — COMPREHENSIVE METABOLIC PANEL
ALT: 14 IU/L (ref 0–32)
AST: 24 IU/L (ref 0–40)
Albumin/Globulin Ratio: 2.1 (ref 1.2–2.2)
Albumin: 4.4 g/dL (ref 3.9–4.9)
Alkaline Phosphatase: 53 IU/L (ref 44–121)
BUN/Creatinine Ratio: 22 (ref 9–23)
BUN: 16 mg/dL (ref 6–24)
Bilirubin Total: 0.3 mg/dL (ref 0.0–1.2)
CO2: 22 mmol/L (ref 20–29)
Calcium: 9.3 mg/dL (ref 8.7–10.2)
Chloride: 106 mmol/L (ref 96–106)
Creatinine, Ser: 0.73 mg/dL (ref 0.57–1.00)
Globulin, Total: 2.1 g/dL (ref 1.5–4.5)
Glucose: 90 mg/dL (ref 70–99)
Potassium: 4.7 mmol/L (ref 3.5–5.2)
Sodium: 142 mmol/L (ref 134–144)
Total Protein: 6.5 g/dL (ref 6.0–8.5)
eGFR: 103 mL/min/{1.73_m2} (ref >59)

## 2022-08-19 LAB — LIPID PANEL
Chol/HDL Ratio: 3.3 ratio (ref 0.0–4.4)
Cholesterol, Total: 195 mg/dL (ref 100–199)
HDL: 60 mg/dL (ref >39)
LDL Chol Calc (NIH): 105 mg/dL — ABNORMAL HIGH (ref 0–99)
Triglycerides: 176 mg/dL — ABNORMAL HIGH (ref 0–149)
VLDL Cholesterol Cal: 30 mg/dL (ref 5–40)

## 2022-08-19 LAB — CBC WITH DIFFERENTIAL/PLATELET
Basophils Absolute: 0.1 10*3/uL (ref 0.0–0.2)
Basos: 1 %
EOS (ABSOLUTE): 0.2 10*3/uL (ref 0.0–0.4)
Eos: 3 %
Hematocrit: 41.8 % (ref 34.0–46.6)
Hemoglobin: 13.9 g/dL (ref 11.1–15.9)
Immature Grans (Abs): 0 10*3/uL (ref 0.0–0.1)
Immature Granulocytes: 0 %
Lymphocytes Absolute: 1.7 10*3/uL (ref 0.7–3.1)
Lymphs: 26 %
MCH: 31 pg (ref 26.6–33.0)
MCHC: 33.3 g/dL (ref 31.5–35.7)
MCV: 93 fL (ref 79–97)
Monocytes Absolute: 0.4 10*3/uL (ref 0.1–0.9)
Monocytes: 6 %
Neutrophils Absolute: 4.4 10*3/uL (ref 1.4–7.0)
Neutrophils: 64 %
Platelets: 306 10*3/uL (ref 150–450)
RBC: 4.49 x10E6/uL (ref 3.77–5.28)
RDW: 11.9 % (ref 11.7–15.4)
WBC: 6.8 10*3/uL (ref 3.4–10.8)

## 2022-08-19 LAB — HEPATITIS C ANTIBODY: Hep C Virus Ab: NONREACTIVE

## 2022-08-19 LAB — HEMOGLOBIN A1C
Est. average glucose Bld gHb Est-mCnc: 103 mg/dL
Hgb A1c MFr Bld: 5.2 % (ref 4.8–5.6)

## 2022-08-19 LAB — HIV ANTIBODY (ROUTINE TESTING W REFLEX): HIV Screen 4th Generation wRfx: NONREACTIVE

## 2022-08-19 LAB — TSH+FREE T4
Free T4: 1.23 ng/dL (ref 0.82–1.77)
TSH: 2.56 u[IU]/mL (ref 0.450–4.500)

## 2022-09-05 ENCOUNTER — Ambulatory Visit: Payer: 59 | Admitting: Family Medicine

## 2022-10-14 ENCOUNTER — Ambulatory Visit
Admission: RE | Admit: 2022-10-14 | Discharge: 2022-10-14 | Disposition: A | Discharge status: Home / Self Care | Payer: 59 | Source: Ambulatory Visit | Attending: Family Medicine | Admitting: Family Medicine

## 2022-10-14 DIAGNOSIS — Z1231 Encounter for screening mammogram for malignant neoplasm of breast: Secondary | ICD-10-CM | POA: Insufficient documentation

## 2022-10-15 NOTE — Progress Notes (Signed)
Hi Diane Love  Normal mammogram; repeat in 1 year.  Please let Diane Love know if you have any questions.  Thank you,  Merita Norton, FNP

## 2022-10-30 ENCOUNTER — Encounter: Payer: Self-pay | Admitting: Family Medicine

## 2022-11-05 ENCOUNTER — Other Ambulatory Visit: Payer: Self-pay | Admitting: Family Medicine

## 2022-11-05 DIAGNOSIS — I83812 Varicose veins of left lower extremities with pain: Secondary | ICD-10-CM

## 2022-11-27 ENCOUNTER — Other Ambulatory Visit (INDEPENDENT_AMBULATORY_CARE_PROVIDER_SITE_OTHER): Payer: Self-pay | Admitting: Nurse Practitioner

## 2022-11-27 DIAGNOSIS — I83812 Varicose veins of left lower extremities with pain: Secondary | ICD-10-CM

## 2022-12-03 ENCOUNTER — Ambulatory Visit (INDEPENDENT_AMBULATORY_CARE_PROVIDER_SITE_OTHER): Payer: 59 | Admitting: Nurse Practitioner

## 2022-12-03 ENCOUNTER — Ambulatory Visit (INDEPENDENT_AMBULATORY_CARE_PROVIDER_SITE_OTHER): Payer: 59

## 2022-12-03 ENCOUNTER — Encounter (INDEPENDENT_AMBULATORY_CARE_PROVIDER_SITE_OTHER): Payer: Self-pay | Admitting: Nurse Practitioner

## 2022-12-03 VITALS — BP 112/71 | HR 71 | Resp 16 | Ht 65.5 in | Wt 165.2 lb

## 2022-12-03 DIAGNOSIS — I83812 Varicose veins of left lower extremities with pain: Secondary | ICD-10-CM

## 2022-12-03 NOTE — Progress Notes (Signed)
Subjective:    Patient ID: Diane Love, female    DOB: 1975-11-21, 47 y.o.   MRN: [id number] Chief Complaint  Patient presents with   New Patient (Initial Visit)    Ref Suzie Portela consult left le varicose veins with pain    Diane Love is a 47 year old female that is seen for evaluation of symptomatic varicose veins. The patient relates burning and stinging which worsened steadily throughout the course of the day, particularly with standing. The patient also notes an aching and throbbing pain over the varicosities, particularly with prolonged dependent positions. The symptoms are significantly improved with elevation.  The patient also notes that during hot weather the symptoms are greatly intensified. The patient states the pain from the varicose veins interferes with work, daily exercise, shopping and household maintenance. At this point, the symptoms are persistent and severe enough that they're having a negative impact on lifestyle and are interfering with daily activities.  There is no history of DVT, PE or superficial thrombophlebitis. There is no history of ulceration or hemorrhage. The patient endorses a significant family history of varicose veins.  The patient has worn graduated compression in the past. At the present time the patient has been using over-the-counter analgesics. There is no history of prior surgical intervention or sclerotherapy.  Today noninvasive studies show no evidence of DVT in the left lower extremity.  No evidence of deep venous insufficiency.  There is significant venous reflux in the great saphenous vein beginning in the saphenofemoral junction extending to the proximal calf      Review of Systems  Cardiovascular:  Positive for leg swelling.  All other systems reviewed and are negative.      Objective:   Physical Exam Vitals reviewed.  HENT:     Head: Normocephalic.  Cardiovascular:     Rate and Rhythm: Normal rate.     Pulses: Normal pulses.   Pulmonary:     Effort: Pulmonary effort is normal.  Musculoskeletal:     Left lower leg: Edema present.  Skin:    General: Skin is warm and dry.  Neurological:     Mental Status: She is alert and oriented to person, place, and time.  Psychiatric:        Mood and Affect: Mood normal.        Behavior: Behavior normal.        Thought Content: Thought content normal.        Judgment: Judgment normal.     BP 112/71 (BP Location: Right Arm)   Pulse 71   Resp 16   Ht 5' 5.5" (1.664 m)   Wt 165 lb 3.2 oz (74.9 kg)   BMI 27.07 kg/m   Past Medical History:  Diagnosis Date   Allergy    Seasonal since moving to [place] in 1993   Cancer (HCC)    Skin, basil cell & pre carcinoma   Family history of adverse reaction to anesthesia    History of kidney stones    PONV (postoperative nausea and vomiting)     Social History   Socioeconomic History   Marital status: Married    Spouse name: Not on file   Number of children: Not on file   Years of education: Not on file   Highest education level: Not on file  Occupational History   Not on file  Tobacco Use   Smoking status: Never   Smokeless tobacco: Never  Vaping Use   Vaping Use: Never used  Substance and Sexual Activity   Alcohol use: Yes    Alcohol/week: 3.0 standard drinks of alcohol    Types: 3 Glasses of wine per week    Comment: rarely   Drug use: No   Sexual activity: Yes    Birth control/protection: None  Other Topics Concern   Not on file  Social History Narrative   Not on file   Social Determinants of Health   Financial Resource Strain: Not on file  Food Insecurity: Not on file  Transportation Needs: Not on file  Physical Activity: Not on file  Stress: Not on file  Social Connections: Not on file  Intimate Partner Violence: Not on file    Past Surgical History:  Procedure Laterality Date   DILATION AND EVACUATION N/A 08/24/2017   Procedure: DILATATION AND EVACUATION;  Surgeon: Schermerhorn, Ihor Austin, MD;   Location: [facility] ORS;  Service: Gynecology;  Laterality: N/A;   HAND SURGERY Right    Thumb tumor excision   MOHS SURGERY  2015   BCC excision nose    Family History  Problem Relation Age of Onset   Breast cancer Mother 10   Cancer Mother    Varicose Veins Mother    Diabetes Maternal Grandmother    Diabetes Paternal Grandmother     Allergies  Allergen Reactions   Walnut Itching and Rash       Latest Ref Rng & Units 08/18/2022    2:20 PM 08/24/2017   10:22 AM  CBC  WBC 3.4 - 10.8 x10E3/uL 6.8  7.0   Hemoglobin 11.1 - 15.9 g/dL 65.7  84.6   Hematocrit 34.0 - 46.6 % 41.8  39.7   Platelets 150 - 450 x10E3/uL 306  311       CMP     Component Value Date/Time   NA 142 08/18/2022 1420   K 4.7 08/18/2022 1420   CL 106 08/18/2022 1420   CO2 22 08/18/2022 1420   GLUCOSE 90 08/18/2022 1420   GLUCOSE 89 08/24/2017 1022   BUN 16 08/18/2022 1420   CREATININE 0.73 08/18/2022 1420   CALCIUM 9.3 08/18/2022 1420   PROT 6.5 08/18/2022 1420   ALBUMIN 4.4 08/18/2022 1420   AST 24 08/18/2022 1420   ALT 14 08/18/2022 1420   ALKPHOS 53 08/18/2022 1420   BILITOT 0.3 08/18/2022 1420   EGFR 103 08/18/2022 1420   GFRNONAA >60 08/24/2017 1022     No results found.     Assessment & Plan:   1. Varicose veins of left lower extremity with pain Recommend  I have reviewed my previous  discussion with the patient regarding  varicose veins and why they cause symptoms. Patient will continue  wearing graduated compression stockings class 1 on a daily basis, beginning first thing in the morning and removing them in the evening.  The patient is CEAP [id number].  The patient has been wearing compression for more than 12 weeks with no or little benefit.  The patient has been exercising daily for more than 12 weeks. The patient has been elevating and taking OTC pain medications for more than 12 weeks.  None of these have have eliminated the pain related to the varicose veins and venous reflux or the  discomfort regarding venous congestion.    In addition, behavioral modification including elevation during the day was again discussed and this will continue.  The patient has utilized over the counter pain medications and has been exercising.  However, at this time conservative therapy has not alleviated  the patient's symptoms of leg pain and swelling  Recommend: laser ablation of the left great saphenous veins to eliminate the symptoms of pain and swelling of the lower extremities caused by the severe superficial venous reflux disease.    Current Outpatient Medications on File Prior to Visit  Medication Sig Dispense Refill   Ashwagandha 300 MG TABS      Bacillus Coagulans-Inulin (PROBIOTIC) 1-250 BILLION-MG CAPS      Cetirizine HCl 10 MG CAPS Take 10 mg by mouth daily.     fluticasone (FLONASE) 50 MCG/ACT nasal spray Place 1 spray into both nostrils daily as needed for allergies or rhinitis.     Multiple Vitamins-Minerals (WOMENS MULTI) CAPS      Omega 3 1000 MG CAPS      Fexofenadine HCl (ALLEGRA PO) Take by mouth daily.     loratadine (CLARITIN) 10 MG tablet Take 10 mg by mouth daily. (Patient not taking: Reported on 08/18/2022)     No current facility-administered medications on file prior to visit.    There are no Patient Instructions on file for this visit. No follow-ups on file.   Georgiana Spinner, NP

## 2022-12-26 ENCOUNTER — Telehealth (INDEPENDENT_AMBULATORY_CARE_PROVIDER_SITE_OTHER): Payer: Self-pay | Admitting: Nurse Practitioner

## 2022-12-26 NOTE — Telephone Encounter (Signed)
Spoke with pt regarding her laser ablation procedure. I explained the backlog of supplies etc. I advised that I would be obtaining a PA as soon as I get to her "place in line". Pt acknowledged.

## 2023-02-20 ENCOUNTER — Other Ambulatory Visit (INDEPENDENT_AMBULATORY_CARE_PROVIDER_SITE_OTHER): Payer: Self-pay

## 2023-02-23 MED ORDER — ALPRAZOLAM 0.5 MG PO TABS: ORAL_TABLET | ORAL | 0 refills | Status: DC

## 2023-04-10 ENCOUNTER — Ambulatory Visit (INDEPENDENT_AMBULATORY_CARE_PROVIDER_SITE_OTHER): Payer: 59 | Admitting: Vascular Surgery

## 2023-04-10 ENCOUNTER — Encounter (INDEPENDENT_AMBULATORY_CARE_PROVIDER_SITE_OTHER): Payer: Self-pay | Admitting: Vascular Surgery

## 2023-04-10 VITALS — BP 127/84 | HR 80 | Resp 18

## 2023-04-10 DIAGNOSIS — I83812 Varicose veins of left lower extremities with pain: Secondary | ICD-10-CM | POA: Diagnosis not present

## 2023-04-10 NOTE — Progress Notes (Signed)
  Diane Love is a 47 y.o. female who presents with symptomatic venous reflux  Past Medical History:  Diagnosis Date   Allergy    Seasonal since moving to [place] in 1993   Cancer [facility])    Skin, basil cell & pre carcinoma   Family history of adverse reaction to anesthesia    History of kidney stones    PONV (postoperative nausea and vomiting)     Past Surgical History:  Procedure Laterality Date   DILATION AND EVACUATION N/A 08/24/2017   Procedure: DILATATION AND EVACUATION;  Surgeon: Schermerhorn, Ihor Austin, MD;  Location: [facility] ORS;  Service: Gynecology;  Laterality: N/A;   HAND SURGERY Right    Thumb tumor excision   MOHS SURGERY  2015   BCC excision nose     Current Outpatient Medications:    ALPRAZolam (XANAX) 0.5 MG tablet, Take 1 tab one hour before procedure and 1 tab when you arrive in office, Disp: 2 tablet, Rfl: 0   Ashwagandha 300 MG TABS, , Disp: , Rfl:    Bacillus Coagulans-Inulin (PROBIOTIC) 1-250 BILLION-MG CAPS, , Disp: , Rfl:    Cetirizine HCl 10 MG CAPS, Take 10 mg by mouth daily., Disp: , Rfl:    Fexofenadine HCl (ALLEGRA PO), Take by mouth daily., Disp: , Rfl:    fluticasone (FLONASE) 50 MCG/ACT nasal spray, Place 1 spray into both nostrils daily as needed for allergies or rhinitis., Disp: , Rfl:    loratadine (CLARITIN) 10 MG tablet, Take 10 mg by mouth daily. (Patient not taking: Reported on 08/18/2022), Disp: , Rfl:    Multiple Vitamins-Minerals (WOMENS MULTI) CAPS, , Disp: , Rfl:    Omega 3 1000 MG CAPS, , Disp: , Rfl:   Allergies  Allergen Reactions   Walnut Itching and Rash     Varicose veins of left lower extremity with pain     PLAN: The patient's left lower extremity was sterilely prepped and draped. The ultrasound machine was used to visualize the saphenous vein throughout its course. A segment in the mid to upper calf was selected for access. The saphenous vein was accessed without difficulty using ultrasound guidance with a micropuncture needle.  A 0.018 wire was then placed beyond the saphenofemoral junction and the needle was removed. The 65 cm sheath was then placed over the wire and the wire and dilator were removed. The laser fiber was then placed through the sheath and its tip was placed approximately 4-5 centimeters below the saphenofemoral junction. Tumescent anesthesia was then created with a dilute lidocaine solution. Laser energy was then delivered with constant withdrawal of the sheath and laser fiber. Approximately 1281 joules of energy were delivered over a length of 34 centimeters using a 1470 Hz VenaCure machine at 7 W. Sterile dressings were placed. The patient tolerated the procedure well without obvious complications.   Follow-up in 1 week with post-laser duplex.

## 2023-04-17 ENCOUNTER — Ambulatory Visit (INDEPENDENT_AMBULATORY_CARE_PROVIDER_SITE_OTHER): Payer: 59

## 2023-04-17 DIAGNOSIS — I83812 Varicose veins of left lower extremities with pain: Secondary | ICD-10-CM

## 2023-05-12 ENCOUNTER — Ambulatory Visit (INDEPENDENT_AMBULATORY_CARE_PROVIDER_SITE_OTHER): Payer: 59 | Admitting: Vascular Surgery

## 2023-05-12 VITALS — BP 102/72 | HR 72 | Resp 16 | Wt 174.8 lb

## 2023-05-12 DIAGNOSIS — I83812 Varicose veins of left lower extremities with pain: Secondary | ICD-10-CM | POA: Diagnosis not present

## 2023-05-12 DIAGNOSIS — Z85828 Personal history of other malignant neoplasm of skin: Secondary | ICD-10-CM

## 2023-05-12 NOTE — Assessment & Plan Note (Signed)
Surgically removed

## 2023-05-12 NOTE — Progress Notes (Signed)
MRN : [id number]  Diane Love is a 47 y.o. ([birth year]) female who presents with chief complaint of  Chief Complaint  Patient presents with   Follow-up    4 week post laser follow up  .  History of Present Illness: Patient returns today in follow up of her venous insufficiency.  She is about a month status post laser ablation of the left great saphenous vein for symptomatic reflux with pain and swelling.  The area was very tender for several weeks and is only starting to get better now.  It was also quite bruised.  Her swelling is better and she really does not have any appreciable swelling at this point on a daily basis.  She had a postprocedural duplex which showed a successful ablation without DVT.  Current Outpatient Medications  Medication Sig Dispense Refill   Bacillus Coagulans-Inulin (PROBIOTIC) 1-250 BILLION-MG CAPS      fluticasone (FLONASE) 50 MCG/ACT nasal spray Place 1 spray into both nostrils daily as needed for allergies or rhinitis.     Multiple Vitamins-Minerals (WOMENS MULTI) CAPS      Omega 3 1000 MG CAPS      ALPRAZolam (XANAX) 0.5 MG tablet Take 1 tab one hour before procedure and 1 tab when you arrive in office 2 tablet 0   Ashwagandha 300 MG TABS      Cetirizine HCl 10 MG CAPS Take 10 mg by mouth daily.     Fexofenadine HCl (ALLEGRA PO) Take by mouth daily.     loratadine (CLARITIN) 10 MG tablet Take 10 mg by mouth daily. (Patient not taking: Reported on 08/18/2022)     No current facility-administered medications for this visit.    Past Medical History:  Diagnosis Date   Allergy    Seasonal since moving to [place] in 1993   Cancer [facility])    Skin, basil cell & pre carcinoma   Family history of adverse reaction to anesthesia    History of kidney stones    PONV (postoperative nausea and vomiting)     Past Surgical History:  Procedure Laterality Date   DILATION AND EVACUATION N/A 08/24/2017   Procedure: DILATATION AND EVACUATION;  Surgeon: Schermerhorn,  Ihor Austin, MD;  Location: [facility] ORS;  Service: Gynecology;  Laterality: N/A;   HAND SURGERY Right    Thumb tumor excision   MOHS SURGERY  2015   BCC excision nose     Social History   Tobacco Use   Smoking status: Never   Smokeless tobacco: Never  Vaping Use   Vaping status: Never Used  Substance Use Topics   Alcohol use: Yes    Alcohol/week: 3.0 standard drinks of alcohol    Types: 3 Glasses of wine per week    Comment: rarely   Drug use: No      Family History  Problem Relation Age of Onset   Breast cancer Mother 10   Cancer Mother    Varicose Veins Mother    Diabetes Maternal Grandmother    Diabetes Paternal Grandmother      Allergies  Allergen Reactions   Walnut Itching and Rash     REVIEW OF SYSTEMS (Negative unless checked)  Constitutional: [] Weight loss  [] Fever  [] Chills Cardiac: [] Chest pain   [] Chest pressure   [] Palpitations   [] Shortness of breath when laying flat   [] Shortness of breath at rest   [] Shortness of breath with exertion. Vascular:  [] Pain in legs with walking   [] Pain in legs at rest   []   Pain in legs when laying flat   [] Claudication   [] Pain in feet when walking  [] Pain in feet at rest  [] Pain in feet when laying flat   [] History of DVT   [] Phlebitis   [x] Swelling in legs   [x] Varicose veins   [] Non-healing ulcers Pulmonary:   [] Uses home oxygen   [] Productive cough   [] Hemoptysis   [] Wheeze  [] COPD   [] Asthma Neurologic:  [] Dizziness  [] Blackouts   [] Seizures   [] History of stroke   [] History of TIA  [] Aphasia   [] Temporary blindness   [] Dysphagia   [] Weakness or numbness in arms   [] Weakness or numbness in legs Musculoskeletal:  [] Arthritis   [] Joint swelling   [] Joint pain   [] Low back pain Hematologic:  [] Easy bruising  [] Easy bleeding   [] Hypercoagulable state   [] Anemic   Gastrointestinal:  [] Blood in stool   [] Vomiting blood  [] Gastroesophageal reflux/heartburn   [] Abdominal pain Genitourinary:  [] Chronic kidney disease   [] Difficult  urination  [] Frequent urination  [] Burning with urination   [] Hematuria Skin:  [] Rashes   [] Ulcers   [] Wounds Psychological:  [] History of anxiety   []  History of major depression.  Physical Examination  BP 102/72 (BP Location: Left Arm)   Pulse 72   Resp 16   Wt 174 lb 12.8 oz (79.3 kg)   BMI 28.65 kg/m  Gen:  WD/WN, NAD Head: [place]/AT, No temporalis wasting. Ear/Nose/Throat: Hearing grossly intact, nares w/o erythema or drainage Eyes: Conjunctiva clear. Sclera non-icteric Neck: Supple.  Trachea midline Pulmonary:  Good air movement, no use of accessory muscles.  Cardiac: RRR, no JVD Vascular:  Vessel Right Left  Radial Palpable Palpable           Musculoskeletal: M/S 5/5 throughout.  No deformity or atrophy.  Scattered varicosities residual in the left lower extremity.  A few areas of mild bruising persist after laser ablation.  No significant left lower extremity edema. Neurologic: Sensation grossly intact in extremities.  Symmetrical.  Speech is fluent.  Psychiatric: Judgment intact, Mood & affect appropriate for pt's clinical situation. Dermatologic: No rashes or ulcers noted.  No cellulitis or open wounds.      Labs No results found for this or any previous visit (from the past 2160 hour(s)).  Radiology VAS [place] LASER ABLATION OF SUPERFICIAL VEIN  Result Date: 04/17/2023  Lower Venous DVT Study Patient Name:  MAREDITH SLAVICK  Date of Exam:   04/17/2023 Medical Rec #: [id number]       Accession #:    [id number] Date of Birth: 03-14-1976        Patient Gender: F Patient Age:   34 years Exam Location:  [facility] Procedure:      VAS [place] LOWER EXTREMITY VENOUS (DVT) Referring Phys: Festus Barren --------------------------------------------------------------------------------  Performing Technologist: Hardie Lora RVT  Examination Guidelines: A complete evaluation includes B-mode imaging, spectral Doppler, color Doppler, and power Doppler as needed of all accessible portions  of each vessel. Bilateral testing is considered an integral part of a complete examination. Limited examinations for reoccurring indications may be performed as noted. The reflux portion of the exam is performed with the patient in reverse Trendelenburg.  Post Ablation: Follow up evaluation status post ablation of left Great Saphenous Vein on 04/10/2023.  +-----+---------------+---------+-----------+----------+--------------+ RIGHTCompressibilityPhasicitySpontaneityPropertiesThrombus Aging +-----+---------------+---------+-----------+----------+--------------+ CFV  Full           Yes      Yes                                 +-----+---------------+---------+-----------+----------+--------------+   +---------+---------------+---------+-----------+----------+--------------+  LEFT     CompressibilityPhasicitySpontaneityPropertiesThrombus Aging +---------+---------------+---------+-----------+----------+--------------+ CFV      Full           Yes      Yes                                 +---------+---------------+---------+-----------+----------+--------------+ FV Prox  Full                                                        +---------+---------------+---------+-----------+----------+--------------+ FV Mid   Full                                                        +---------+---------------+---------+-----------+----------+--------------+ FV DistalFull                                                        +---------+---------------+---------+-----------+----------+--------------+ GSV      None           No       No         dilated   Acute          +---------+---------------+---------+-----------+----------+--------------+     Summary: LEFT: - Successful ablation of left great saphenous vein.  Post Ablation Summary: - No evidence of deep vein thrombosis from the left common femoral through the popliteal veins. - Successful ablation of the left Great Saphenous  Vein from the Knee to within 3 cm of SFJ.  *See table(s) above for measurements and observations. Electronically signed by Festus Barren MD on 04/17/2023 at 1:27:52 PM.    Final     Assessment/Plan  History of skin cancer Surgically removed  Varicose veins of left lower extremity with pain The patient has undergone successful laser ablation of the left great saphenous vein.  She is still in the recovery and healing process from this but overall her swelling is improved.  She does have some residual varicosities but they are not currently bothering her enough to consider sclerotherapy for.  I will see her back as needed.    Festus Barren, MD  05/12/2023 9:26 AM    This note was created with Dragon medical transcription system.  Any errors from dictation are purely unintentional

## 2023-05-12 NOTE — Assessment & Plan Note (Signed)
The patient has undergone successful laser ablation of the left great saphenous vein.  She is still in the recovery and healing process from this but overall her swelling is improved.  She does have some residual varicosities but they are not currently bothering her enough to consider sclerotherapy for.  I will see her back as needed.

## 2023-10-27 ENCOUNTER — Encounter (INDEPENDENT_AMBULATORY_CARE_PROVIDER_SITE_OTHER): Payer: Self-pay

## 2024-01-21 ENCOUNTER — Ambulatory Visit (INDEPENDENT_AMBULATORY_CARE_PROVIDER_SITE_OTHER): Admitting: Family Medicine

## 2024-01-21 ENCOUNTER — Encounter: Payer: Self-pay | Admitting: Family Medicine

## 2024-01-21 VITALS — BP 114/77 | HR 93 | Ht 65.0 in | Wt 170.0 lb

## 2024-01-21 DIAGNOSIS — Z1231 Encounter for screening mammogram for malignant neoplasm of breast: Secondary | ICD-10-CM

## 2024-01-21 DIAGNOSIS — Z0000 Encounter for general adult medical examination without abnormal findings: Secondary | ICD-10-CM | POA: Diagnosis not present

## 2024-01-21 DIAGNOSIS — Z13228 Encounter for screening for other metabolic disorders: Secondary | ICD-10-CM

## 2024-01-21 DIAGNOSIS — Z789 Other specified health status: Secondary | ICD-10-CM | POA: Diagnosis not present

## 2024-01-21 DIAGNOSIS — J029 Acute pharyngitis, unspecified: Secondary | ICD-10-CM

## 2024-01-21 DIAGNOSIS — Z136 Encounter for screening for cardiovascular disorders: Secondary | ICD-10-CM | POA: Diagnosis not present

## 2024-01-21 DIAGNOSIS — J301 Allergic rhinitis due to pollen: Secondary | ICD-10-CM

## 2024-01-21 DIAGNOSIS — Z1329 Encounter for screening for other suspected endocrine disorder: Secondary | ICD-10-CM

## 2024-01-21 DIAGNOSIS — N951 Menopausal and female climacteric states: Secondary | ICD-10-CM

## 2024-01-21 DIAGNOSIS — Z130 Encounter for screening for diseases of the blood and blood-forming organs and certain disorders involving the immune mechanism: Secondary | ICD-10-CM

## 2024-01-21 LAB — POCT RAPID STREP A (OFFICE): Rapid Strep A Screen: NEGATIVE

## 2024-01-21 NOTE — Progress Notes (Signed)
 Annual Physical Exam   Patient: Diane Love   DOB: July 28, 1975   48 y.o. Female  MRN: [id number] Visit Date: 01/21/2024  Today's healthcare provider: LAURAINE LOISE BUOY, DO   Chief Complaint  Patient presents with   Transitions Of Care    Sore throat that started yesterday, and sinus has been bothering her since last week   Subjective    HPI Diane Love is a 48 year old female who presents with a sore throat and overdue health screenings.  She has been experiencing a sore throat since yesterday, describing it as a 'pretty raw feeling' but not severe enough to avoid swallowing. She attributes the sore throat to postnasal drip, which started after hiking [place]  where her allergies were triggered by blooming flora. She notes sinus pressure and pain, but minimal nasal discharge. No fever, chills, headache, chest pain, or shortness of breath.  She has not received the HPV vaccine. She was advised to have annual mammograms due to her mother's history of breast cancer, now stage four. She has not had any abnormal mammograms in the past. Regarding colorectal cancer screening, she completed an [facility] home FIT test a month or two ago through her insurance. She finds colonoscopy invasive and prefers alternative screening methods.  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 She is uncertain about her hepatitis B vaccination status. She receives the flu shot annually but has not taken the COVID-19 vaccine.  She reports sporadic menstrual periods, consistent with perimenopause, and uses a two-pill regimen daily to manage hot flashes and mood changes, which she finds helpful. She occasionally consumes alcohol, typically a glass of wine or beer once or twice a week. She is currently taking cetirizine and Flonase for allergies and uses Amberen for sleep  related to perimenopausal symptoms.  She experiences occasional arthritis pain but nothing consistent.       Medications: Outpatient Medications Prior to Visit  Medication Sig   cetirizine (ALLERGY, CETIRIZINE,) 10 MG tablet    Evening Primrose Oil 1000 MG CAPS    MISC NATURAL PRODUCTS PO Take 2 tablets by mouth daily. Amberen for hot flashes   Omega 3 1000 MG CAPS  (Patient taking differently: 2,000 capsules.)   Bacillus Coagulans-Inulin (PROBIOTIC) 1-250 BILLION-MG CAPS    fluticasone (FLONASE) 50 MCG/ACT nasal spray Place 1 spray into both nostrils daily as needed for allergies or rhinitis.   Multiple Vitamins-Minerals (WOMENS MULTI) CAPS    [DISCONTINUED] ALPRAZolam (XANAX) 0.5 MG tablet Take 1 tab one hour before procedure and 1 tab when you arrive in office   [DISCONTINUED] Ashwagandha 300 MG TABS    [DISCONTINUED] Cetirizine HCl 10 MG CAPS Take 10 mg by mouth daily.   [DISCONTINUED] Fexofenadine HCl (ALLEGRA PO) Take by mouth daily.   [DISCONTINUED] loratadine (CLARITIN) 10 MG tablet Take 10 mg by mouth daily. (Patient not taking: Reported on 08/18/2022)   No facility-administered medications prior to visit.    Review of Systems  Constitutional:  Negative for chills, fatigue and fever.  HENT:  Positive for congestion, postnasal drip, sinus pressure, sinus pain, sore throat and trouble swallowing. Negative for ear pain, rhinorrhea and sneezing.   Eyes: Negative.  Negative for pain, redness and visual disturbance.  Respiratory:  Negative for cough, shortness of breath and wheezing.   Cardiovascular:  Negative for chest pain and leg  swelling.  Gastrointestinal:  Negative for abdominal pain, blood in stool, constipation, diarrhea and nausea.  Endocrine: Negative for polydipsia and polyphagia.  Genitourinary: Negative.  Negative for dysuria, flank pain, hematuria, pelvic pain, vaginal bleeding and vaginal discharge.  Musculoskeletal:  Negative for arthralgias, back pain, gait  problem and joint swelling.  Skin:  Negative for rash.  Neurological: Negative.  Negative for dizziness, tremors, seizures, weakness, light-headedness, numbness and headaches.  Hematological:  Negative for adenopathy.  Psychiatric/Behavioral: Negative.  Negative for behavioral problems, confusion and dysphoric mood. The patient is not nervous/anxious and is not hyperactive.         Objective    BP 114/77   Pulse 93   Ht 5' 5 (1.651 m)   Wt 170 lb (77.1 kg)   SpO2 100%   BMI 28.29 kg/m     Physical Exam Vitals and nursing note reviewed.  Constitutional:      General: She is awake.     Appearance: Normal appearance.  HENT:     Head: Normocephalic and atraumatic.     Right Ear: Tympanic membrane, ear canal and external ear normal.     Left Ear: Tympanic membrane, ear canal and external ear normal.     Nose: Nose normal.     Mouth/Throat:     Mouth: Mucous membranes are moist.     Pharynx: Oropharynx is clear. Posterior oropharyngeal erythema (mild) present. No oropharyngeal exudate.  Eyes:     General: No scleral icterus.    Extraocular Movements: Extraocular movements intact.     Conjunctiva/sclera: Conjunctivae normal.     Pupils: Pupils are equal, round, and reactive to light.  Neck:     Thyroid: No thyromegaly or thyroid tenderness.  Cardiovascular:     Rate and Rhythm: Normal rate and regular rhythm.     Pulses: Normal pulses.     Heart sounds: Normal heart sounds.  Pulmonary:     Effort: Pulmonary effort is normal. No tachypnea, bradypnea or respiratory distress.     Breath sounds: Normal breath sounds. No stridor. No wheezing, rhonchi or rales.  Abdominal:     General: Bowel sounds are normal. There is no distension.     Palpations: Abdomen is soft. There is no mass.     Tenderness: There is no abdominal tenderness. There is no guarding.     Hernia: No hernia is present.  Musculoskeletal:     Cervical back: Normal range of motion and neck supple.     Right  lower leg: No edema.     Left lower leg: No edema.  Lymphadenopathy:     Cervical: Cervical adenopathy (tonsillar) present.  Skin:    General: Skin is warm and dry.  Neurological:     Mental Status: She is alert and oriented to person, place, and time. Mental status is at baseline.  Psychiatric:        Mood and Affect: Mood normal.        Behavior: Behavior normal.      Results for orders placed or performed in visit on 01/21/24  POCT rapid strep A  Result Value Ref Range   Rapid Strep A Screen Negative Negative    Assessment & Plan    Annual physical exam  Transition of care  Seasonal allergic rhinitis due to pollen  Sore throat -     POCT rapid strep A  Perimenopause  Encounter for screening mammogram for breast cancer -     3D Screening Mammogram, Left and Right;  Future  Encounter for screening for cardiovascular disorders -     Lipid panel  Screening for endocrine, metabolic and immunity disorder -     Comprehensive metabolic panel with GFR     Annual physical exam Routine visit. Physical exam overall unremarkable except as noted above. Routine lab work ordered as noted. Discussed overdue screenings and vaccinations. No varicose vein issues post-treatment. - Order blood work, non-fasting. - Order mammogram due to family history. - Discuss [facility] results, consider Cologuard. - Review and update tetanus TDAP vaccine if needed. - Discuss and update hepatitis B vaccination if necessary.  Sore throat with postnasal drip Sore throat likely due to postnasal drip from allergic rhinitis.  Rapid strep swab negative. - Continue supportive care, getting plenty of rest and hydration.  Tylenol and ibuprofen for pain/fever as needed.  Allergic rhinitis Allergic rhinitis exacerbated by high altitude and blooming flora. Managed with cetirizine and Flonase. - Continue cetirizine and Flonase.  Perimenopausal symptoms Perimenopausal symptoms include sporadic  menstrual cycles, hot flashes, and mood changes.  On current OTC regimen for hot flashes; she is unclear if it is effective yet or not. - Continue current OTC two-pill regimen. - Can consider prescription medication options if it proves ineffective.    Return in about 3 months (around 04/22/2024) for Pap, and 1 year for CPE.      I discussed the assessment and treatment plan with the patient  The patient was provided an opportunity to ask questions and all were answered. The patient agreed with the plan and demonstrated an understanding of the instructions.   The patient was advised to call back or seek an in-person evaluation if the symptoms worsen or if the condition fails to improve as anticipated.    LAURAINE LOISE BUOY, DO  [facility] [phone] (phone) [phone] (fax)  [facility]

## 2024-01-22 LAB — COMPREHENSIVE METABOLIC PANEL WITH GFR
ALT: 43 IU/L — ABNORMAL HIGH (ref 0–32)
AST: 38 IU/L (ref 0–40)
Albumin: 4.2 g/dL (ref 3.9–4.9)
Alkaline Phosphatase: 46 IU/L (ref 44–121)
BUN/Creatinine Ratio: 16 (ref 9–23)
BUN: 13 mg/dL (ref 6–24)
Bilirubin Total: 0.3 mg/dL (ref 0.0–1.2)
CO2: 21 mmol/L (ref 20–29)
Calcium: 9.4 mg/dL (ref 8.7–10.2)
Chloride: 103 mmol/L (ref 96–106)
Creatinine, Ser: 0.81 mg/dL (ref 0.57–1.00)
Globulin, Total: 2.3 g/dL (ref 1.5–4.5)
Glucose: 85 mg/dL (ref 70–99)
Potassium: 4.6 mmol/L (ref 3.5–5.2)
Sodium: 138 mmol/L (ref 134–144)
Total Protein: 6.5 g/dL (ref 6.0–8.5)
eGFR: 91 mL/min/1.73 (ref >59)

## 2024-01-22 LAB — LIPID PANEL
Chol/HDL Ratio: 3.4 ratio (ref 0.0–4.4)
Cholesterol, Total: 198 mg/dL (ref 100–199)
HDL: 58 mg/dL (ref >39)
LDL Chol Calc (NIH): 114 mg/dL — ABNORMAL HIGH (ref 0–99)
Triglycerides: 148 mg/dL (ref 0–149)
VLDL Cholesterol Cal: 26 mg/dL (ref 5–40)

## 2024-02-01 ENCOUNTER — Ambulatory Visit: Payer: Self-pay | Admitting: Family Medicine

## 2024-04-21 ENCOUNTER — Other Ambulatory Visit (HOSPITAL_COMMUNITY)
Admission: RE | Admit: 2024-04-21 | Discharge: 2024-04-21 | Disposition: A | Discharge status: Home / Self Care | Source: Ambulatory Visit | Attending: Family Medicine | Admitting: Family Medicine

## 2024-04-21 ENCOUNTER — Encounter: Payer: Self-pay | Admitting: Family Medicine

## 2024-04-21 ENCOUNTER — Ambulatory Visit (INDEPENDENT_AMBULATORY_CARE_PROVIDER_SITE_OTHER): Admitting: Family Medicine

## 2024-04-21 VITALS — BP 113/76 | HR 75 | Resp 14 | Ht 65.0 in | Wt 169.7 lb

## 2024-04-21 DIAGNOSIS — Z124 Encounter for screening for malignant neoplasm of cervix: Secondary | ICD-10-CM | POA: Insufficient documentation

## 2024-04-21 DIAGNOSIS — E785 Hyperlipidemia, unspecified: Secondary | ICD-10-CM

## 2024-04-21 DIAGNOSIS — R7401 Elevation of levels of liver transaminase levels: Secondary | ICD-10-CM

## 2024-04-21 NOTE — Patient Instructions (Signed)
 Please call the [facility] [phone]) to schedule a routine screening mammogram.

## 2024-04-21 NOTE — Progress Notes (Signed)
 Established patient visit   Patient: Diane Love   DOB: Jun 30, 1975   48 y.o. Female  MRN: [id number] Visit Date: 04/21/2024  Today's healthcare provider: LAURAINE LOISE BUOY, DO   Chief Complaint  Patient presents with   Gynecologic Exam   Subjective    Gynecologic Exam   Diane Love is a 48 year old female who presents for a routine follow-up and discussion of perimenopausal symptoms.  She experiences irregular menstrual cycles, with periods varying between four to eight weeks apart. Her last menstrual period was on April 06, 2024.  She is experiencing perimenopausal symptoms, including sleeplessness and occasional hot flashes. She has been taking Ambren for almost a year, which has helped control the hot flashes during the day, but she still experiences restlessness and frequent awakenings at night. She sometimes wakes up feeling hot and needs to remove clothing to cool down.  No current issues with discharge, pain, burning, itching, malodor, or burning with urination. She is sexually active and reports no pain during intercourse.  She works in the school system and enjoys hiking.      Medications: Outpatient Medications Prior to Visit  Medication Sig   Bacillus Coagulans-Inulin (PROBIOTIC) 1-250 BILLION-MG CAPS    cetirizine (ALLERGY, CETIRIZINE,) 10 MG tablet    Evening Primrose Oil 1000 MG CAPS    fluticasone (FLONASE) 50 MCG/ACT nasal spray Place 1 spray into both nostrils daily as needed for allergies or rhinitis.   MISC NATURAL PRODUCTS PO Take 2 tablets by mouth daily. Amberen for hot flashes   Multiple Vitamins-Minerals (WOMENS MULTI) CAPS    Omega 3 1000 MG CAPS  (Patient taking differently: 2,000 capsules.)   No facility-administered medications prior to visit.        Objective    BP 113/76   Pulse 75   Resp 14   Ht 5' 5 (1.651 m)   Wt 169 lb 11.2 oz (77 kg)   LMP 04/06/2024   SpO2 98%   BMI 28.24 kg/m     Physical Exam Vitals and  nursing note reviewed.  Constitutional:      General: She is not in acute distress.    Appearance: Normal appearance.  HENT:     Head: Normocephalic and atraumatic.  Eyes:     General: No scleral icterus.    Conjunctiva/sclera: Conjunctivae normal.  Cardiovascular:     Rate and Rhythm: Normal rate.  Pulmonary:     Effort: Pulmonary effort is normal.  Genitourinary:    General: Normal vulva.     Exam position: Lithotomy position.     Pubic Area: No rash.      Tanner stage (genital): 5.     Labia:        Right: No rash, tenderness, lesion or injury.        Left: No rash, tenderness, lesion or injury.      Vagina: Normal.     Cervix: Discharge (scant, clear) and lesion (reddish/purplish discoloration as indicated on diagram) present. No cervical motion tenderness, friability, erythema, cervical bleeding or eversion.     Lymphadenopathy:     Lower Body: No right inguinal adenopathy. No left inguinal adenopathy.  Neurological:     Mental Status: She is alert and oriented to person, place, and time. Mental status is at baseline.  Psychiatric:        Mood and Affect: Mood normal.        Behavior: Behavior normal.      Results  for orders placed or performed in visit on 04/21/24  Cytology - PAP  Result Value Ref Range   High risk HPV Negative    Adequacy      Satisfactory for evaluation; transformation zone component PRESENT.   Diagnosis      - Negative for intraepithelial lesion or malignancy (NILM)   Comment Normal Reference Range HPV - Negative     Assessment & Plan    Pap smear for cervical cancer screening -     Cytology - PAP  Elevated ALT measurement -     Comprehensive metabolic panel with GFR  Hyperlipidemia LDL goal <100 -     Lipid panel     Pap smear for cervical cancer screening Routine visit with no abnormal findings. Discussed mammogram referral, COVID booster declined, and vaccination status. - Call [facility] for mammogram referral. -  Check Hepatitis B vaccination status. - Check Tdap vaccination status. - Ordered labs in three months.  Perimenopausal symptoms Experiencing sleeplessness and hot flashes. Ambren provides partial relief. Discussed hormone therapy risks and alternatives including SSRIs, Veozah, and clonidine. - Consider hormone therapy with progesterone. - Consider SSRIs for symptom management. - Consider Veozah with liver enzyme monitoring.  Elevated ALT measurement Mild elevation noted, no immediate concern for significant liver damage. - Recheck CMP in three months.  Hyperlipidemia LDL goal <100 Noted.  Managed with lifestyle modifications. - Recheck lipid panel in 3 months.   Return in about 9 months (around 01/23/2025) for CPE, Chronic f/u.      I discussed the assessment and treatment plan with the patient  The patient was provided an opportunity to ask questions and all were answered. The patient agreed with the plan and demonstrated an understanding of the instructions.   The patient was advised to call back or seek an in-person evaluation if the symptoms worsen or if the condition fails to improve as anticipated.    LAURAINE LOISE BUOY, DO  [facility] [phone] (phone) [phone] (fax)  [facility]

## 2024-04-22 LAB — CYTOLOGY - PAP
Comment: NEGATIVE
Diagnosis: NEGATIVE
High risk HPV: NEGATIVE

## 2024-04-23 ENCOUNTER — Ambulatory Visit: Payer: Self-pay | Admitting: Family Medicine

## 2024-05-26 ENCOUNTER — Encounter: Payer: Self-pay | Admitting: Radiology

## 2024-05-26 ENCOUNTER — Inpatient Hospital Stay: Admission: RE | Admit: 2024-05-26 | Discharge: 2024-05-26 | Attending: Family Medicine | Admitting: Family Medicine

## 2024-05-26 DIAGNOSIS — Z1231 Encounter for screening mammogram for malignant neoplasm of breast: Secondary | ICD-10-CM | POA: Diagnosis present

## 2025-01-27 ENCOUNTER — Encounter
